# Patient Record
Sex: Female | Born: 1965
Health system: Southern US, Community
[De-identification: ages and names within clinical notes are randomized; demographics above are authoritative.]

## PROBLEM LIST (undated history)

## (undated) DIAGNOSIS — D571 Sickle-cell disease without crisis: Secondary | ICD-10-CM

## (undated) DIAGNOSIS — C801 Malignant (primary) neoplasm, unspecified: Secondary | ICD-10-CM

## (undated) HISTORY — DX: Sickle-cell disease without crisis: D57.1

## (undated) HISTORY — PX: BREAST SURGERY: SHX581

## (undated) HISTORY — PX: ABDOMINAL HYSTERECTOMY: SHX81

## (undated) HISTORY — DX: Malignant (primary) neoplasm, unspecified: C80.1

---

## 1997-07-01 ENCOUNTER — Emergency Department (HOSPITAL_COMMUNITY): Admission: EM | Admit: 1997-07-01 | Discharge: 1997-07-01 | Payer: Self-pay | Admitting: Emergency Medicine

## 1998-06-10 ENCOUNTER — Emergency Department (HOSPITAL_COMMUNITY): Admission: EM | Admit: 1998-06-10 | Discharge: 1998-06-10 | Payer: Self-pay | Admitting: Emergency Medicine

## 1999-04-04 ENCOUNTER — Encounter: Payer: Self-pay | Admitting: Emergency Medicine

## 1999-04-04 ENCOUNTER — Emergency Department (HOSPITAL_COMMUNITY): Admission: EM | Admit: 1999-04-04 | Discharge: 1999-04-04 | Payer: Self-pay | Admitting: Emergency Medicine

## 1999-09-28 ENCOUNTER — Emergency Department (HOSPITAL_COMMUNITY): Admission: EM | Admit: 1999-09-28 | Discharge: 1999-09-28 | Payer: Self-pay

## 1999-12-22 ENCOUNTER — Encounter: Payer: Self-pay | Admitting: *Deleted

## 1999-12-22 ENCOUNTER — Encounter: Admission: RE | Admit: 1999-12-22 | Discharge: 1999-12-22 | Payer: Self-pay | Admitting: *Deleted

## 2003-01-29 ENCOUNTER — Other Ambulatory Visit: Admission: RE | Admit: 2003-01-29 | Discharge: 2003-02-12 | Payer: Self-pay | Admitting: Internal Medicine

## 2003-01-29 ENCOUNTER — Other Ambulatory Visit: Admission: RE | Admit: 2003-01-29 | Discharge: 2003-01-29 | Payer: Self-pay | Admitting: Family Medicine

## 2004-06-23 ENCOUNTER — Encounter: Admission: RE | Admit: 2004-06-23 | Discharge: 2004-06-23 | Payer: Self-pay | Admitting: Internal Medicine

## 2004-07-14 ENCOUNTER — Encounter: Admission: RE | Admit: 2004-07-14 | Discharge: 2004-07-14 | Payer: Self-pay | Admitting: Internal Medicine

## 2004-11-23 ENCOUNTER — Emergency Department (HOSPITAL_COMMUNITY): Admission: EM | Admit: 2004-11-23 | Discharge: 2004-11-23 | Payer: Self-pay | Admitting: Family Medicine

## 2007-03-06 ENCOUNTER — Emergency Department (HOSPITAL_COMMUNITY): Admission: EM | Admit: 2007-03-06 | Discharge: 2007-03-06 | Payer: Self-pay | Admitting: Emergency Medicine

## 2007-04-25 ENCOUNTER — Emergency Department (HOSPITAL_COMMUNITY): Admission: EM | Admit: 2007-04-25 | Discharge: 2007-04-25 | Payer: Self-pay | Admitting: Family Medicine

## 2009-03-15 ENCOUNTER — Inpatient Hospital Stay (HOSPITAL_COMMUNITY): Admission: EM | Admit: 2009-03-15 | Discharge: 2009-03-16 | Payer: Self-pay | Admitting: Emergency Medicine

## 2009-04-10 ENCOUNTER — Inpatient Hospital Stay (HOSPITAL_COMMUNITY): Admission: AD | Admit: 2009-04-10 | Discharge: 2009-04-11 | Payer: Self-pay | Admitting: Obstetrics and Gynecology

## 2009-06-13 ENCOUNTER — Inpatient Hospital Stay (HOSPITAL_COMMUNITY): Admission: RE | Admit: 2009-06-13 | Discharge: 2009-06-15 | Payer: Self-pay | Admitting: Obstetrics and Gynecology

## 2009-06-13 ENCOUNTER — Encounter (INDEPENDENT_AMBULATORY_CARE_PROVIDER_SITE_OTHER): Payer: Self-pay | Admitting: Obstetrics and Gynecology

## 2010-05-28 ENCOUNTER — Emergency Department (HOSPITAL_COMMUNITY)
Admission: EM | Admit: 2010-05-28 | Discharge: 2010-05-28 | Discharge status: Against Medical Advice | Payer: Self-pay | Attending: Emergency Medicine | Admitting: Emergency Medicine

## 2010-05-28 DIAGNOSIS — W298XXA Contact with other powered powered hand tools and household machinery, initial encounter: Secondary | ICD-10-CM | POA: Insufficient documentation

## 2010-05-28 DIAGNOSIS — S61209A Unspecified open wound of unspecified finger without damage to nail, initial encounter: Secondary | ICD-10-CM | POA: Insufficient documentation

## 2010-06-09 LAB — CBC
HCT: 25 % — ABNORMAL LOW (ref 36.0–46.0)
Hemoglobin: 7.9 g/dL — ABNORMAL LOW (ref 12.0–15.0)
MCHC: 31.6 g/dL (ref 30.0–36.0)
MCV: 68 fL — ABNORMAL LOW (ref 78.0–100.0)
Platelets: 252 K/uL (ref 150–400)
RBC: 3.67 MIL/uL — ABNORMAL LOW (ref 3.87–5.11)
RDW: 35.1 % — ABNORMAL HIGH (ref 11.5–15.5)
WBC: 11.6 K/uL — ABNORMAL HIGH (ref 4.0–10.5)

## 2010-06-16 LAB — PREGNANCY, URINE: Preg Test, Ur: NEGATIVE

## 2010-06-16 LAB — CBC
Hemoglobin: 10.6 g/dL — ABNORMAL LOW (ref 12.0–15.0)
Hemoglobin: 9.5 g/dL — ABNORMAL LOW (ref 12.0–15.0)
MCHC: 30.8 g/dL (ref 30.0–36.0)
MCV: 63 fL — ABNORMAL LOW (ref 78.0–100.0)
RBC: 4.69 MIL/uL (ref 3.87–5.11)
RBC: 5.37 MIL/uL — ABNORMAL HIGH (ref 3.87–5.11)
WBC: 13.6 10*3/uL — ABNORMAL HIGH (ref 4.0–10.5)

## 2010-06-16 LAB — BASIC METABOLIC PANEL
CO2: 26 mEq/L (ref 19–32)
Calcium: 8.3 mg/dL — ABNORMAL LOW (ref 8.4–10.5)
Calcium: 9.4 mg/dL (ref 8.4–10.5)
Chloride: 102 mEq/L (ref 96–112)
Creatinine, Ser: 0.59 mg/dL (ref 0.4–1.2)
Creatinine, Ser: 0.6 mg/dL (ref 0.4–1.2)
GFR calc Af Amer: >60 mL/min (ref >60)
GFR calc Af Amer: >60 mL/min (ref >60)
GFR calc non Af Amer: >60 mL/min (ref >60)
Sodium: 134 mEq/L — ABNORMAL LOW (ref 135–145)
Sodium: 135 mEq/L (ref 135–145)

## 2010-06-23 LAB — CROSSMATCH
ABO/RH(D): O POS
Antibody Screen: NEGATIVE

## 2010-06-23 LAB — CBC
HCT: 14.1 % — ABNORMAL LOW (ref 36.0–46.0)
HCT: 15.7 % — ABNORMAL LOW (ref 36.0–46.0)
Hemoglobin: 3.9 g/dL — CL (ref 12.0–15.0)
Hemoglobin: 4.5 g/dL — CL (ref 12.0–15.0)
Hemoglobin: 9.1 g/dL — ABNORMAL LOW (ref 12.0–15.0)
MCHC: 27.9 g/dL — ABNORMAL LOW (ref 30.0–36.0)
MCHC: 28.6 g/dL — ABNORMAL LOW (ref 30.0–36.0)
MCHC: 28.6 g/dL — ABNORMAL LOW (ref 30.0–36.0)
MCHC: 31.5 g/dL (ref 30.0–36.0)
MCV: 48.4 fL — ABNORMAL LOW (ref 78.0–100.0)
MCV: 48.5 fL — ABNORMAL LOW (ref 78.0–100.0)
Platelets: 296 10*3/uL (ref 150–400)
Platelets: 337 K/uL (ref 150–400)
RBC: 2.9 MIL/uL — ABNORMAL LOW (ref 3.87–5.11)
RBC: 3.21 MIL/uL — ABNORMAL LOW (ref 3.87–5.11)
RBC: 3.25 MIL/uL — ABNORMAL LOW (ref 3.87–5.11)
RBC: 4.49 MIL/uL (ref 3.87–5.11)
RDW: 32.9 % — ABNORMAL HIGH (ref 11.5–15.5)
RDW: 33.8 % — ABNORMAL HIGH (ref 11.5–15.5)
RDW: 40.2 % — ABNORMAL HIGH (ref 11.5–15.5)
WBC: 10.2 10*3/uL (ref 4.0–10.5)
WBC: 7.7 10*3/uL (ref 4.0–10.5)
WBC: 8.6 K/uL (ref 4.0–10.5)

## 2010-06-23 LAB — DIFFERENTIAL
Basophils Absolute: 0 K/uL (ref 0.0–0.1)
Basophils Relative: 0 % (ref 0–1)
Blasts: 0 %
Eosinophils Absolute: 0.1 K/uL (ref 0.0–0.7)
Eosinophils Relative: 1 % (ref 0–5)
Lymphocytes Relative: 33 % (ref 12–46)
Lymphs Abs: 3.4 10*3/uL (ref 0.7–4.0)
Monocytes Absolute: 0.5 K/uL (ref 0.1–1.0)
Monocytes Relative: 5 % (ref 3–12)
Myelocytes: 0 %
Neutro Abs: 5.5 10*3/uL (ref 1.7–7.7)
Neutro Abs: 6.2 10*3/uL (ref 1.7–7.7)
Neutrophils Relative %: 61 % (ref 43–77)
Neutrophils Relative %: 65 % (ref 43–77)
Promyelocytes Absolute: 0 %
nRBC: 0 /100 WBC

## 2010-06-23 LAB — VITAMIN B12: Vitamin B-12: 438 pg/mL (ref 211–911)

## 2010-06-23 LAB — POCT I-STAT, CHEM 8
BUN: 5 mg/dL — ABNORMAL LOW (ref 6–23)
Calcium, Ion: 1.26 mmol/L (ref 1.12–1.32)
Chloride: 104 mEq/L (ref 96–112)
Creatinine, Ser: 0.7 mg/dL (ref 0.4–1.2)
Glucose, Bld: 106 mg/dL — ABNORMAL HIGH (ref 70–99)
HCT: 19 % — ABNORMAL LOW (ref 36.0–46.0)
Hemoglobin: 6.5 g/dL — CL (ref 12.0–15.0)
Potassium: 3.6 meq/L (ref 3.5–5.1)
Sodium: 139 meq/L (ref 135–145)
TCO2: 27 mmol/L (ref 0–100)

## 2010-06-23 LAB — PROTEIN ELECTROPHORESIS, SERUM
Albumin ELP: 54 % — ABNORMAL LOW (ref 55.8–66.1)
Alpha-1-Globulin: 5.4 % — ABNORMAL HIGH (ref 2.9–4.9)
Beta 2: 4.4 % (ref 3.2–6.5)
Beta Globulin: 8.1 % — ABNORMAL HIGH (ref 4.7–7.2)

## 2010-06-23 LAB — UIFE/LIGHT CHAINS/TP QN, 24-HR UR
Alpha 1, Urine: DETECTED — AB
Alpha 2, Urine: DETECTED — AB
Beta, Urine: DETECTED — AB
Total Protein, Urine: 0.9 mg/dL

## 2010-06-23 LAB — FOLATE RBC: RBC Folate: 666 ng/mL — ABNORMAL HIGH (ref 180–600)

## 2010-06-23 LAB — BRAIN NATRIURETIC PEPTIDE: Pro B Natriuretic peptide (BNP): 66 pg/mL (ref 0.0–100.0)

## 2010-06-23 LAB — HEMOCCULT GUIAC POC 1CARD (OFFICE): Fecal Occult Bld: NEGATIVE

## 2010-06-23 LAB — IRON AND TIBC
Saturation Ratios: 6 % — ABNORMAL LOW (ref 20–55)
UIBC: 393 ug/dL

## 2010-06-23 LAB — ABO/RH: ABO/RH(D): O POS

## 2010-06-23 LAB — TSH: TSH: 0.991 u[IU]/mL (ref 0.350–4.500)

## 2011-04-08 ENCOUNTER — Emergency Department (HOSPITAL_COMMUNITY)
Admission: EM | Admit: 2011-04-08 | Discharge: 2011-04-08 | Disposition: A | Discharge status: Home / Self Care | Payer: Self-pay | Attending: Emergency Medicine | Admitting: Emergency Medicine

## 2011-04-08 ENCOUNTER — Encounter (HOSPITAL_COMMUNITY): Payer: Self-pay

## 2011-04-08 DIAGNOSIS — R000 Tachycardia, unspecified: Secondary | ICD-10-CM | POA: Insufficient documentation

## 2011-04-08 DIAGNOSIS — R070 Pain in throat: Secondary | ICD-10-CM | POA: Insufficient documentation

## 2011-04-08 DIAGNOSIS — R509 Fever, unspecified: Secondary | ICD-10-CM | POA: Insufficient documentation

## 2011-04-08 DIAGNOSIS — J020 Streptococcal pharyngitis: Secondary | ICD-10-CM | POA: Insufficient documentation

## 2011-04-08 DIAGNOSIS — IMO0001 Reserved for inherently not codable concepts without codable children: Secondary | ICD-10-CM | POA: Insufficient documentation

## 2011-04-08 DIAGNOSIS — R599 Enlarged lymph nodes, unspecified: Secondary | ICD-10-CM | POA: Insufficient documentation

## 2011-04-08 LAB — URINALYSIS, ROUTINE W REFLEX MICROSCOPIC
Bilirubin Urine: NEGATIVE
Glucose, UA: NEGATIVE mg/dL
Ketones, ur: NEGATIVE mg/dL
pH: 5.5 (ref 5.0–8.0)

## 2011-04-08 LAB — URINE MICROSCOPIC-ADD ON

## 2011-04-08 MED ORDER — OXYCODONE-ACETAMINOPHEN 5-325 MG PO TABS: 2.0000 | ORAL_TABLET | ORAL | Status: AC | PRN

## 2011-04-08 MED ORDER — PENICILLIN G BENZATHINE 1200000 UNIT/2ML IM SUSP
1.2000 10*6.[IU] | Freq: Once | INTRAMUSCULAR | Status: AC
  Administered 2011-04-08: 1.2 10*6.[IU] via INTRAMUSCULAR
  Filled 2011-04-08: qty 2

## 2011-04-08 MED ORDER — ACETAMINOPHEN 325 MG PO TABS
650.0000 mg | ORAL_TABLET | Freq: Once | ORAL | Status: AC
  Administered 2011-04-08: 650 mg via ORAL
  Filled 2011-04-08: qty 2

## 2011-04-08 NOTE — ED Notes (Signed)
Pt. Having fever,  Urine smells strong and generalized body aches

## 2011-04-08 NOTE — ED Notes (Signed)
No reaction from PCN injection, pt discharged home

## 2011-04-08 NOTE — ED Provider Notes (Addendum)
History     CSN: [id number]  Arrival date & time 04/08/11  1450   None     Chief Complaint  Patient presents with  . Fever    (Consider location/radiation/quality/duration/timing/severity/associated sxs/prior treatment) Patient is a 46 y.o. female presenting with fever. The history is provided by the patient.  Fever Primary symptoms of the febrile illness include fever and myalgias. Primary symptoms do not include headaches, cough, dysuria or rash.   the patient is a 46 year old, female, with no significant past medical history.  She complains of a fever, and sore throat for 3 days.  She denies nausea, vomiting, cough, diarrhea, rash, or urinary tract symptoms.  She has not been exposed to anybody with similar symptoms.  She has not gotten a flu vaccine this year.  History reviewed. No pertinent past medical history.  Past Surgical History  Procedure Date  . Abdominal hysterectomy     No family history on file.  History  Substance Use Topics  . Smoking status: Current Everyday Smoker  . Smokeless tobacco: Not on file  . Alcohol Use: Yes    OB History    Grav Para Term Preterm Abortions TAB SAB Ect Mult Living                  Review of Systems  Constitutional: Positive for fever and chills.  HENT: Positive for sore throat.   Respiratory: Negative for cough.   Cardiovascular: Negative for chest pain.  Genitourinary: Negative for dysuria.  Musculoskeletal: Positive for myalgias.  Skin: Negative for rash.  Neurological: Negative for headaches.  All other systems reviewed and are negative.    Allergies  Review of patient's allergies indicates no known allergies.  Home Medications   Current Outpatient Rx  Name Route Sig Dispense Refill  . NAPROXEN SODIUM 220 MG PO TABS Oral Take 220 mg by mouth 2 (two) times daily as needed. For pain      BP 135/79  Pulse 106  Temp 102.1 F (38.9 C)  Resp 18  Ht 5\' 4"  (1.626 m)  Wt 185 lb (83.915 kg)  BMI 31.76 kg/m2   SpO2 100%  Physical Exam  Vitals reviewed. Constitutional: She is oriented to person, place, and time. She appears well-developed and well-nourished.  HENT:  Head: Normocephalic and atraumatic.  Mouth/Throat: Oropharyngeal exudate present.  Eyes: Pupils are equal, round, and reactive to light.  Neck: Normal range of motion.  Cardiovascular: Regular rhythm and normal heart sounds.   No murmur heard.      Tachycardia  Pulmonary/Chest: Effort normal and breath sounds normal. No respiratory distress. She has no wheezes. She has no rales.  Abdominal: She exhibits no distension.  Genitourinary:       No CVA tenderness  Musculoskeletal: Normal range of motion. She exhibits no edema and no tenderness.  Lymphadenopathy:    She has cervical adenopathy.  Neurological: She is alert and oriented to person, place, and time. No cranial nerve deficit.  Skin: Skin is warm and dry. No rash noted. No erythema.  Psychiatric: She has a normal mood and affect. Her behavior is normal.    ED Course  Procedures (including critical care time) Exudative pharyngitis with lymphadenopathy and fever.  Symptoms consistent with strep throat.  We will treat her with Bicillin L-A, and Tylenol in emergency department   Labs Reviewed  URINALYSIS, ROUTINE W REFLEX MICROSCOPIC   No results found.   No diagnosis found.    MDM  Exudative pharyngitis.   Nontoxic.  No respiratory distress.  No airway compromise.  She is a normal host.          Nicholes Stairs, MD 04/08/11 1534  Nicholes Stairs, MD 04/08/11 1610  Nicholes Stairs, MD 04/08/11 [phone]

## 2011-09-10 ENCOUNTER — Encounter (HOSPITAL_COMMUNITY): Payer: Self-pay | Admitting: *Deleted

## 2011-09-10 ENCOUNTER — Emergency Department (HOSPITAL_COMMUNITY)
Admission: EM | Admit: 2011-09-10 | Discharge: 2011-09-10 | Disposition: A | Discharge status: Home / Self Care | Payer: Self-pay | Attending: Emergency Medicine | Admitting: Emergency Medicine

## 2011-09-10 DIAGNOSIS — F17200 Nicotine dependence, unspecified, uncomplicated: Secondary | ICD-10-CM | POA: Insufficient documentation

## 2011-09-10 DIAGNOSIS — L255 Unspecified contact dermatitis due to plants, except food: Secondary | ICD-10-CM | POA: Insufficient documentation

## 2011-09-10 DIAGNOSIS — IMO0002 Reserved for concepts with insufficient information to code with codable children: Secondary | ICD-10-CM | POA: Insufficient documentation

## 2011-09-10 DIAGNOSIS — L237 Allergic contact dermatitis due to plants, except food: Secondary | ICD-10-CM

## 2011-09-10 MED ORDER — HYDROXYZINE HCL 25 MG PO TABS: 25.0000 mg | ORAL_TABLET | Freq: Four times a day (QID) | ORAL | Status: AC | PRN

## 2011-09-10 MED ORDER — PREDNISONE 20 MG PO TABS
60.0000 mg | ORAL_TABLET | ORAL | Status: AC
  Administered 2011-09-10: 60 mg via ORAL
  Filled 2011-09-10: qty 3

## 2011-09-10 MED ORDER — PREDNISONE 10 MG PO TABS: 60.0000 mg | ORAL_TABLET | Freq: Every day | ORAL | Status: AC

## 2011-09-10 NOTE — ED Provider Notes (Addendum)
History  This chart was scribed for Gerhard Munch, MD by Bennett Scrape. This patient was seen in room Room/bed info not found and the patient's care was started at 5:37PM.  CSN: [id number]  Arrival date & time 09/10/11  1633   None     Chief Complaint  Patient presents with  . Rash    HPI  Laurie French is a 46 y.o. female who presents to the Emergency Department complaining of 4 days of rash that is itchy and painful on arms, neck, torso, chest and face after she was cutting grass with a weedeater. She denies using pesticide or herbicide. No mouth or genital lesions. No difficult swallowing or breathing. No fevers, emesis and diarrhea. She reports using topical creams without improvement.    History reviewed. No pertinent past medical history.  Past Surgical History  Procedure Date  . Abdominal hysterectomy     No family history on file.  History  Substance Use Topics  . Smoking status: Current Everyday Smoker  . Smokeless tobacco: Not on file  . Alcohol Use: Yes    OB History    Grav Para Term Preterm Abortions TAB SAB Ect Mult Living                  Review of Systems  Allergies  Review of patient's allergies indicates no known allergies.  Home Medications   Current Outpatient Rx  Name Route Sig Dispense Refill  . NAPROXEN SODIUM 220 MG PO TABS Oral Take 220 mg by mouth 2 (two) times daily as needed. For pain      Triage Vitals: BP 131/87  Pulse 99  Temp 98.8 F (37.1 C) (Oral)  Resp 18  Ht 5\' 4"  (1.626 m)  Wt 200 lb (90.719 kg)  BMI 34.33 kg/m2  SpO2 100%  Physical Exam  Nursing note and vitals reviewed. Constitutional: She is oriented to person, place, and time. She appears well-developed and well-nourished. No distress.  HENT:  Head: Normocephalic and atraumatic.  Eyes: Conjunctivae and EOM are normal.  Cardiovascular: Normal rate.   Pulmonary/Chest: Effort normal. No stridor. No respiratory distress.  Abdominal: She exhibits no  distension.  Musculoskeletal: She exhibits no edema.  Neurological: She is alert and oriented to person, place, and time. No cranial nerve deficit.  Skin: Skin is warm and dry.       Diffuse vascular rash on arms, chest, neck and face     Psychiatric: She has a normal mood and affect.    ED Course  Procedures (including critical care time)  COORDINATION OF CARE: 5:41PM-Discussed discharge plan with pt and pt agreed to plan.  Labs Reviewed - No data to display No results found.   No diagnosis found.    MDM  I personally performed the services described in this documentation, which was scribed in my presence. The recorded information has been reviewed and considered.  This previously well female presents with new diffuse rash consistent with exposure to poison ivy or a similar erythematous.  The absence of mucosal lesions, distress, is reassuring, but given the patient's diffuse symptoms, including facial lesions, she'll be started on a short course of steroids to go with antihistamines.  She was discharged in stable condition.      Gerhard Munch, MD 09/10/11 1805  Gerhard Munch, MD 09/10/11 [phone]

## 2011-09-10 NOTE — Discharge Instructions (Signed)
Poison Ivy Poison ivy is a inflammation of the skin (contact dermatitis) caused by touching the allergens on the leaves of the ivy plant following previous exposure to the plant. The rash usually appears 48 hours after exposure. The rash is usually bumps (papules) or blisters (vesicles) in a linear pattern. Depending on your own sensitivity, the rash may simply cause redness and itching, or it may also progress to blisters which may break open. These must be well cared for to prevent secondary bacterial (germ) infection, followed by scarring. Keep any open areas dry, clean, dressed, and covered with an antibacterial ointment if needed. The eyes may also get puffy. The puffiness is worst in the morning and gets better as the day progresses. This dermatitis usually heals without scarring, within 2 to 3 weeks without treatment. HOME CARE INSTRUCTIONS  Thoroughly wash with soap and water as soon as you have been exposed to poison ivy. You have about one half hour to remove the plant resin before it will cause the rash. This washing will destroy the oil or antigen on the skin that is causing, or will cause, the rash. Be sure to wash under your fingernails as any plant resin there will continue to spread the rash. Do not rub skin vigorously when washing affected area. Poison ivy cannot spread if no oil from the plant remains on your body. A rash that has progressed to weeping sores will not spread the rash unless you have not washed thoroughly. It is also important to wash any clothes you have been wearing as these may carry active allergens. The rash will return if you wear the unwashed clothing, even several days later. Avoidance of the plant in the future is the best measure. Poison ivy plant can be recognized by the number of leaves. Generally, poison ivy has three leaves with flowering branches on a single stem. Diphenhydramine may be purchased over the counter and used as needed for itching. Do not drive with  this medication if it makes you drowsy.Ask your caregiver about medication for children. SEEK MEDICAL CARE IF:  Open sores develop.   Redness spreads beyond area of rash.   You notice purulent (pus-like) discharge.   You have increased pain.   Other signs of infection develop (such as fever).  Document Released: 03/06/2000 Document Revised: 02/26/2011 Document Reviewed: 01/23/2009 [employer] Patient Information 2012 [employer]. 

## 2011-09-10 NOTE — ED Notes (Signed)
Patient reports she was woking outside on Saturday.  She is sure she was exposed to poison oak or ivy.  She noted onset of rash on Sunday.  She states the rash has continued to spread. Patient complains of itching.  Patient reports no one else has rash

## 2011-09-23 ENCOUNTER — Encounter (HOSPITAL_COMMUNITY): Payer: Self-pay | Admitting: *Deleted

## 2011-09-23 ENCOUNTER — Emergency Department (HOSPITAL_COMMUNITY)
Admission: EM | Admit: 2011-09-23 | Discharge: 2011-09-23 | Disposition: A | Discharge status: Home / Self Care | Payer: Self-pay | Attending: Emergency Medicine | Admitting: Emergency Medicine

## 2011-09-23 DIAGNOSIS — L0100 Impetigo, unspecified: Secondary | ICD-10-CM | POA: Insufficient documentation

## 2011-09-23 DIAGNOSIS — F17200 Nicotine dependence, unspecified, uncomplicated: Secondary | ICD-10-CM | POA: Insufficient documentation

## 2011-09-23 DIAGNOSIS — B009 Herpesviral infection, unspecified: Secondary | ICD-10-CM | POA: Insufficient documentation

## 2011-09-23 MED ORDER — BENADRYL 25 MG PO TABS: 25.0000 mg | ORAL_TABLET | Freq: Four times a day (QID) | ORAL | Status: DC | PRN

## 2011-09-23 MED ORDER — ACYCLOVIR 400 MG PO TABS: 200.0000 mg | ORAL_TABLET | Freq: Every day | ORAL | Status: AC

## 2011-09-23 MED ORDER — CEPHALEXIN 500 MG PO CAPS: 500.0000 mg | ORAL_CAPSULE | Freq: Four times a day (QID) | ORAL | Status: AC

## 2011-09-23 NOTE — ED Provider Notes (Signed)
History     CSN: [id number]  Arrival date & time 09/23/11  1925   First MD Initiated Contact with Patient 09/23/11 2136      Chief Complaint  Patient presents with  . Pruritis  . Rash    (Consider location/radiation/quality/duration/timing/severity/associated sxs/prior treatment) Patient is a 46 y.o. female presenting with rash. The history is provided by the patient. No language interpreter was used.  Rash  This is a recurrent problem. The current episode started more than 1 week ago. The problem has been gradually worsening. There has been no fever. The rash is present on the trunk, face, left arm and right arm. The pain is at a severity of 3/10. The pain is mild. The pain has been constant since onset. Associated symptoms include itching and pain. Pertinent negatives include no weeping. She has tried anti-itch cream, a cold compress and antihistamines for the symptoms. The treatment provided mild relief.  Rash to chest after treamtment 1 month ago for poisoin ivy.  Also herpetic rash to L buttocks.  History reviewed. No pertinent past medical history.  Past Surgical History  Procedure Date  . Abdominal hysterectomy     No family history on file.  History  Substance Use Topics  . Smoking status: Current Everyday Smoker  . Smokeless tobacco: Not on file  . Alcohol Use: Yes    OB History    Grav Para Term Preterm Abortions TAB SAB Ect Mult Living                  Review of Systems  Constitutional: Negative.  Negative for fever.  HENT: Negative.   Eyes: Negative.   Respiratory: Negative.   Cardiovascular: Negative.   Gastrointestinal: Negative.  Negative for nausea and vomiting.  Skin: Positive for itching and rash.  Neurological: Negative.  Negative for dizziness.  Psychiatric/Behavioral: Negative.   All other systems reviewed and are negative.    Allergies  Review of patient's allergies indicates no known allergies.  Home Medications   Current Outpatient  Rx  Name Route Sig Dispense Refill  . NAPROXEN SODIUM 220 MG PO TABS Oral Take 220 mg by mouth 2 (two) times daily as needed. For pain      BP 123/86  Pulse 83  Temp 98.2 F (36.8 C) (Oral)  Resp 16  SpO2 99%  Physical Exam  Nursing note and vitals reviewed. Constitutional: She is oriented to person, place, and time. She appears well-developed and well-nourished.  HENT:  Head: Normocephalic and atraumatic.  Eyes: Conjunctivae and EOM are normal. Pupils are equal, round, and reactive to light.  Neck: Normal range of motion. Neck supple.  Cardiovascular: Normal rate, regular rhythm and normal heart sounds.   Pulmonary/Chest: Effort normal and breath sounds normal.  Abdominal: Soft. Bowel sounds are normal.  Musculoskeletal: Normal range of motion. She exhibits no edema and no tenderness.  Neurological: She is alert and oriented to person, place, and time. She has normal reflexes.  Skin: Skin is warm and dry. Rash noted.       Impetigo to chest and herpetic lesion to L buttock  Psychiatric: She has a normal mood and affect.    ED Course  Procedures (including critical care time)  Labs Reviewed - No data to display No results found.   No diagnosis found.    MDM  Impetigo to chest and herpetic lesion to L buttocks.  Treated with keflex and acyclovir.  Benadryl for itching.  Return if worse.  Dr. Margo Aye referral  for dermatology.          Remi Haggard, NP 09/24/11 [phone]

## 2011-09-23 NOTE — ED Notes (Signed)
Pt was seen 2 weeks ago and told she has poision ivy.  Pt took medication for this without relief and she now has generalized itching rash.  No other person in her household has anything similar

## 2011-09-23 NOTE — ED Notes (Signed)
Pt for discharge.Discharge instruction give.GCS 15.

## 2011-09-28 NOTE — ED Provider Notes (Signed)
Medical screening examination/treatment/procedure(s) were performed by non-physician practitioner and as supervising physician I was immediately available for consultation/collaboration.  Pheng Prokop, MD 09/28/11 0716 

## 2011-11-26 ENCOUNTER — Emergency Department (HOSPITAL_COMMUNITY)
Admission: EM | Admit: 2011-11-26 | Discharge: 2011-11-26 | Disposition: A | Discharge status: Home / Self Care | Payer: Self-pay | Attending: Emergency Medicine | Admitting: Emergency Medicine

## 2011-11-26 ENCOUNTER — Emergency Department (HOSPITAL_COMMUNITY): Payer: Self-pay

## 2011-11-26 ENCOUNTER — Encounter (HOSPITAL_COMMUNITY): Payer: Self-pay | Admitting: Emergency Medicine

## 2011-11-26 DIAGNOSIS — X500XXA Overexertion from strenuous movement or load, initial encounter: Secondary | ICD-10-CM | POA: Insufficient documentation

## 2011-11-26 DIAGNOSIS — S93409A Sprain of unspecified ligament of unspecified ankle, initial encounter: Secondary | ICD-10-CM

## 2011-11-26 DIAGNOSIS — S99929A Unspecified injury of unspecified foot, initial encounter: Secondary | ICD-10-CM | POA: Insufficient documentation

## 2011-11-26 DIAGNOSIS — Y93H9 Activity, other involving exterior property and land maintenance, building and construction: Secondary | ICD-10-CM | POA: Insufficient documentation

## 2011-11-26 DIAGNOSIS — Y998 Other external cause status: Secondary | ICD-10-CM | POA: Insufficient documentation

## 2011-11-26 DIAGNOSIS — F17200 Nicotine dependence, unspecified, uncomplicated: Secondary | ICD-10-CM | POA: Insufficient documentation

## 2011-11-26 DIAGNOSIS — S8990XA Unspecified injury of unspecified lower leg, initial encounter: Secondary | ICD-10-CM | POA: Insufficient documentation

## 2011-11-26 NOTE — ED Notes (Signed)
Pt requested to know how long she would have to wear splint form. This RN asked dr. Freida Busman and reported to pt 7-10 days. Pt threw her d/c instructions down and stated "well I need new paperwork." This RN informed the pt that the paperwork recommended she follow up with an orthopedist and that it is still recommended and that I could write on the paperwork how long it was recommended for pt to wear splint per Dr. Freida Busman. Pt began to yell at this RN stating "I need someone new to help me because you have a type A personality and so do I and I don't like it." This RN obliged and Crecencio Mc, RN went in to d/c pt.

## 2011-11-26 NOTE — ED Notes (Signed)
Pt states that on Friday she was mowing grass and took a bad step. States that she put mud on it and the swelling went down some, but it is still hurting and swollen. Pt able to ambulate. Swelling noted to right ankle.

## 2011-11-26 NOTE — ED Notes (Signed)
MD at bedside. 

## 2011-11-26 NOTE — ED Notes (Signed)
Ortho paged for splint/crutches °

## 2011-11-26 NOTE — ED Notes (Signed)
Pt c/o right ankle pain after stepping wrong on Friday; pt sts still having pain

## 2011-11-26 NOTE — ED Provider Notes (Signed)
History   Scribed for Toy Baker, MD, the patient was seen in room TR11C/TR11C . This chart was scribed by Lewanda Rife.   CSN: [id number]  Arrival date & time 11/26/11  1300   First MD Initiated Contact with Patient 11/26/11 1406      Chief Complaint  Patient presents with  . Ankle Pain    (Consider location/radiation/quality/duration/timing/severity/associated sxs/prior treatment) The history is provided by the patient.   Laurie French is a 46 y.o. female who presents to the Emergency Department complaining of moderate intermittent right ankle pain for the past 7 days after twisting it when cutting the grass. Pt states pain intensity increases with movement and bearing weight. Pt reports she tried using mud with vinegar to decrease swelling and reports some relief with this "home remedy."  History reviewed. No pertinent past medical history.  Past Surgical History  Procedure Date  . Abdominal hysterectomy     History reviewed. No pertinent family history.  History  Substance Use Topics  . Smoking status: Current Everyday Smoker  . Smokeless tobacco: Not on file  . Alcohol Use: Yes    OB History    Grav Para Term Preterm Abortions TAB SAB Ect Mult Living                  Review of Systems  Constitutional: Negative.   HENT: Negative.   Respiratory: Negative.   Cardiovascular: Negative.   Gastrointestinal: Negative.   Musculoskeletal: Negative.        Right ankle pain   Skin: Negative.   Neurological: Negative.   Hematological: Negative.   Psychiatric/Behavioral: Negative.   All other systems reviewed and are negative.    Allergies  Latex  Home Medications   Current Outpatient Rx  Name Route Sig Dispense Refill  . OVER THE COUNTER MEDICATION Topical Apply 1 application topically daily as needed. For irritation   Deep roots healing cream      BP 150/90  Pulse 85  Temp 98.8 F (37.1 C) (Oral)  Resp 18  SpO2 98%  Physical Exam    Nursing note and vitals reviewed. Constitutional: She is oriented to person, place, and time. She appears well-developed and well-nourished.  HENT:  Head: Normocephalic and atraumatic.  Eyes: Conjunctivae are normal. Pupils are equal, round, and reactive to light.  Neck: Neck supple. No tracheal deviation present. No thyromegaly present.  Cardiovascular: Normal rate and regular rhythm.   No murmur heard. Pulmonary/Chest: Effort normal and breath sounds normal.  Abdominal: Soft. Bowel sounds are normal. She exhibits no distension. There is no tenderness.  Musculoskeletal: Normal range of motion. She exhibits no edema and no tenderness.       Right ankle has moderate swelling with no ecchymosis, skin is intact Mild tenderness to palpation on distal right lateral malleolus    Neurological: She is alert and oriented to person, place, and time. Coordination normal.  Skin: Skin is warm and dry. No rash noted.  Psychiatric: She has a normal mood and affect.    ED Course  Procedures (including critical care time)  Labs Reviewed - No data to display Dg Ankle Complete Right  11/26/2011  *RADIOLOGY REPORT*  Clinical Data: Pain post trauma  RIGHT ANKLE - COMPLETE 3+ VIEW  Comparison: None.  Findings: Frontal, oblique, and lateral views were obtained.  There is swelling laterally.  There is no apparent fracture or effusion. The ankle mortise appears intact.  There is mild spurring in the dorsal mid foot region.  IMPRESSION: Soft tissue swelling laterally.  No fracture or effusion.   Original Report Authenticated By: Arvin Collard. WOODRUFF III, M.D.      No diagnosis found.    MDM  Splint and crutches given      I personally performed the services described in this documentation, which was scribed in my presence. The recorded information has been reviewed and considered.    Toy Baker, MD 11/26/11 [phone]

## 2011-11-26 NOTE — Progress Notes (Signed)
Orthopedic Tech Progress Note Patient Details:  Laurie French Aug 16, 1965 [id number]  Ortho Devices Type of Ortho Device: ASO;Crutches Ortho Device/Splint Location: right ankle Ortho Device/Splint Interventions: Application   Peggyann Zwiefelhofer 11/26/2011, 2:54 PM

## 2015-02-01 ENCOUNTER — Other Ambulatory Visit: Payer: Self-pay

## 2015-02-01 DIAGNOSIS — Z1231 Encounter for screening mammogram for malignant neoplasm of breast: Secondary | ICD-10-CM

## 2015-02-27 ENCOUNTER — Ambulatory Visit: Payer: Self-pay

## 2015-03-24 DIAGNOSIS — C801 Malignant (primary) neoplasm, unspecified: Secondary | ICD-10-CM

## 2015-03-24 HISTORY — DX: Malignant (primary) neoplasm, unspecified: C80.1

## 2015-07-19 ENCOUNTER — Ambulatory Visit
Admission: RE | Admit: 2015-07-19 | Discharge: 2015-07-19 | Disposition: A | Discharge status: Home / Self Care | Payer: BLUE CROSS/BLUE SHIELD | Source: Ambulatory Visit

## 2015-07-19 DIAGNOSIS — Z1231 Encounter for screening mammogram for malignant neoplasm of breast: Secondary | ICD-10-CM

## 2015-07-23 ENCOUNTER — Other Ambulatory Visit: Payer: Self-pay | Admitting: Internal Medicine

## 2015-07-23 DIAGNOSIS — R928 Other abnormal and inconclusive findings on diagnostic imaging of breast: Secondary | ICD-10-CM

## 2015-07-26 ENCOUNTER — Ambulatory Visit
Admission: RE | Admit: 2015-07-26 | Discharge: 2015-07-26 | Disposition: A | Discharge status: Home / Self Care | Payer: BLUE CROSS/BLUE SHIELD | Source: Ambulatory Visit | Attending: Internal Medicine | Admitting: Internal Medicine

## 2015-07-26 ENCOUNTER — Other Ambulatory Visit: Payer: Self-pay | Admitting: Internal Medicine

## 2015-07-26 DIAGNOSIS — R928 Other abnormal and inconclusive findings on diagnostic imaging of breast: Secondary | ICD-10-CM

## 2015-07-26 DIAGNOSIS — N6310 Unspecified lump in the right breast, unspecified quadrant: Secondary | ICD-10-CM

## 2015-07-30 ENCOUNTER — Other Ambulatory Visit: Payer: Self-pay | Admitting: Internal Medicine

## 2015-07-30 DIAGNOSIS — N6310 Unspecified lump in the right breast, unspecified quadrant: Secondary | ICD-10-CM

## 2015-07-31 ENCOUNTER — Ambulatory Visit
Admission: RE | Admit: 2015-07-31 | Discharge: 2015-07-31 | Disposition: A | Discharge status: Home / Self Care | Payer: BLUE CROSS/BLUE SHIELD | Source: Ambulatory Visit | Attending: Internal Medicine | Admitting: Internal Medicine

## 2015-07-31 DIAGNOSIS — N6310 Unspecified lump in the right breast, unspecified quadrant: Secondary | ICD-10-CM

## 2015-11-05 ENCOUNTER — Ambulatory Visit (INDEPENDENT_AMBULATORY_CARE_PROVIDER_SITE_OTHER): Payer: BLUE CROSS/BLUE SHIELD | Admitting: Physician Assistant

## 2015-11-05 VITALS — BP 126/80 | HR 114 | Temp 98.4°F | Resp 18 | Ht 64.0 in | Wt 231.0 lb

## 2015-11-05 DIAGNOSIS — C50919 Malignant neoplasm of unspecified site of unspecified female breast: Secondary | ICD-10-CM | POA: Insufficient documentation

## 2015-11-05 DIAGNOSIS — T7840XA Allergy, unspecified, initial encounter: Secondary | ICD-10-CM | POA: Diagnosis not present

## 2015-11-05 MED ORDER — RANITIDINE HCL 150 MG PO TABS: 150.0000 mg | ORAL_TABLET | Freq: Two times a day (BID) | ORAL | 0 refills | Status: DC

## 2015-11-05 MED ORDER — EPINEPHRINE 0.3 MG/0.3ML IJ SOAJ: 0.3000 mg | Freq: Once | INTRAMUSCULAR | 1 refills | Status: AC

## 2015-11-05 NOTE — Progress Notes (Signed)
Laurie French  MRN: [id number] DOB: 06-04-1965  PCP: PROVIDER NOT IN SYSTEM  Subjective:  Pt is a 50 year old female currently on chemotherapy every 3 weeks for breast cancer (dx June 2017), presents to clinic for allergic reaction. Two nights ago she ate [place] and mashed potatoes with gravy and cranberry sauce at [employer]. Less than an hour later she felt itchy all over. That night at home she ate her [employer] leftovers from the same meal and again became itchy all over. She soon noted swelling of her lower lip and tongue and the feeling that her "throat felt funny and was closing up". She took one benadryl and went to sleep. Then next morning her throat and tongue symptoms had cleared, but she still had a swollen lower lip.  Last night, she fixed organic chicken wings from the [employer] and again experienced a funny feeling in her tongue and throat and as if her throat was swelling. She took one benadryl.   She is here today because she has hives and is itching all over her body and she has swelling of her lower lip. Also states her tongue "feels funny".  Denies difficulty breathing today, abdominal pain, nausea, vomiting, wheezing  This is the first episode of an allergic reaction. No known food allergies. Allergic to laytex.   Her chemo treatment does make her itchy on palms and soles only. Last chemo treatment 8 days ago via port in her left breast. Gets chemo every 3 weeks.  Diagnosed with cancer in her right breast June 2017.    Review of Systems  Constitutional: Positive for appetite change (due to current chemo treatment regimen ). Negative for diaphoresis.  HENT: Positive for facial swelling (lower lip and tongue) and trouble swallowing.   Respiratory: Positive for shortness of breath (difficulty breathing ). Negative for apnea, cough, choking and chest tightness.   Cardiovascular: Negative.   Gastrointestinal: Negative.   Skin: Positive for rash (wheals all over body).    Allergic/Immunologic: Positive for food allergies (unknown food allergy. Episode occured after meal) and immunocompromised state (breast cancer with current chemo treatment ). Negative for environmental allergies.  Neurological: Negative.   Psychiatric/Behavioral: Negative for decreased concentration and sleep disturbance. The patient is not nervous/anxious.     There are no active problems to display for this patient.   Current Outpatient Prescriptions on File Prior to Visit  Medication Sig Dispense Refill  . OVER THE COUNTER MEDICATION Apply 1 application topically daily as needed. For irritation   Deep roots healing cream     No current facility-administered medications on file prior to visit.     Allergies  Allergen Reactions  . Latex Itching and Rash    Objective:  BP 126/80 (BP Location: Right Arm, Patient Position: Sitting, Cuff Size: Large)   Pulse (!) 114   Temp 98.4 F (36.9 C) (Oral)   Resp 18   Ht 5\' 4"  (1.626 m)   Wt 231 lb (104.8 kg)   SpO2 98%   BMI 39.65 kg/m   Physical Exam  Constitutional: She is well-developed, well-nourished, and in no distress. No distress.  HENT:  Angioedema of lower lip. No swelling of tongue or oropharynx noted.   Cardiovascular: Regular rhythm, S1 normal, S2 normal and normal pulses.  Tachycardia present.   Skin: Skin is warm and dry. Rash noted. Rash is urticarial. She is not diaphoretic.  Urticarial plaques on face, b/l arms, legs, back and abdomen. The patient scratches them multiple times  throughout the exam.   Vitals reviewed.   Assessment and Plan :  1. Allergic reaction, initial encounter - EPINEPHrine 0.3 mg/0.3 mL IJ SOAJ injection; Inject 0.3 mLs (0.3 mg total) into the muscle once.  Dispense: 2 Device; Refill: 1 - ranitidine (ZANTAC) 150 MG tablet; Take 1 tablet (150 mg total) by mouth 2 (two) times daily.  Dispense: 60 tablet; Refill: 0 - Ambulatory referral to Allergy  - Patient declines steroid medications. Says  they make her "puff up". Discussed the benefit of steroids for her condition, she understands and refuses.  - Patient instructed to follow a two week regimen of Zantac, Zyrtec and Benadryl to reduce her current symptoms and to prevent her from rebounding.  She understands. Patient understands to use her Epi pen and to go immediately to the ED should she experience episodes of problems with her airway.   Mercer Pod, PA-C  [facility] 11/05/2015 12:34 PM

## 2015-11-05 NOTE — Patient Instructions (Addendum)
Please go get Zyrtec from your pharmacy. Take Zantac, Zyrtec and Benadryl as directed for the next TWO WEEKS.  This will help you from having a rebound reaction. Go to the emergency department if you feel your tongue/throat swelling or are having difficulty breathing.      Anaphylactic Reaction An anaphylactic reaction is a sudden, severe allergic reaction that involves the whole body. It can be life threatening. A hospital stay is often required. People with asthma, eczema, or hay fever are slightly more likely to have an anaphylactic reaction. CAUSES  An anaphylactic reaction may be caused by anything to which you are allergic. After being exposed to the allergic substance, your immune system becomes sensitized to it. When you are exposed to that allergic substance again, an allergic reaction can occur. Common causes of an anaphylactic reaction include:  Medicines.  Foods, especially peanuts, wheat, shellfish, milk, and eggs.  Insect bites or stings.  Blood products.  Chemicals, such as dyes, latex, and contrast material used for imaging tests. SYMPTOMS  When an allergic reaction occurs, the body releases histamine and other substances. These substances cause symptoms such as tightening of the airway. Symptoms often develop within seconds or minutes of exposure. Symptoms may include:  Skin rash or hives.  Itching.  Chest tightness.  Swelling of the eyes, tongue, or lips.  Trouble breathing or swallowing.  Lightheadedness or fainting.  Anxiety or confusion.  Stomach pains, vomiting, or diarrhea.  Nasal congestion.  A fast or irregular heartbeat (palpitations). DIAGNOSIS  Diagnosis is based on your history of recent exposure to allergic substances, your symptoms, and a physical exam. Your caregiver may also perform blood or urine tests to confirm the diagnosis. TREATMENT  Epinephrine medicine is the main treatment for an anaphylactic reaction. Other medicines that may be  used for treatment include antihistamines, steroids, and albuterol. In severe cases, fluids and medicine to support blood pressure may be given through an intravenous line (IV). Even if you improve after treatment, you need to be observed to make sure your condition does not get worse. This may require a stay in the hospital. [place] a medical alert bracelet or necklace stating your allergy.  You and your family must learn how to use an anaphylaxis kit or give an epinephrine injection to temporarily treat an emergency allergic reaction. Always carry your epinephrine injection or anaphylaxis kit with you. This can be lifesaving if you have a severe reaction.  Do not drive or perform tasks after treatment until the medicines used to treat your reaction have worn off, or until your caregiver says it is okay.  If you have hives or a rash:  Take medicines as directed by your caregiver.  You may use an over-the-counter antihistamine (diphenhydramine) as needed.  Apply cold compresses to the skin or take baths in cool water. Avoid hot baths or showers. SEEK MEDICAL CARE IF:   You develop symptoms of an allergic reaction to a new substance. Symptoms may start right away or minutes later.  You develop a rash, hives, or itching.  You develop new symptoms. SEEK IMMEDIATE MEDICAL CARE IF:   You have swelling of the mouth, difficulty breathing, or wheezing.  You have a tight feeling in your chest or throat.  You develop hives, swelling, or itching all over your body.  You develop severe vomiting or diarrhea.  You feel faint or pass out. This is an emergency. Use your epinephrine injection or anaphylaxis kit as you have  been instructed. Call your local emergency services (911 in [place] Even if you improve after the injection, you need to be examined at a hospital emergency department. MAKE SURE YOU:   Understand these instructions.  Will watch your condition.  Will  get help right away if you are not doing well or get worse.   This information is not intended to replace advice given to you by your health care provider. Make sure you discuss any questions you have with your health care provider.   Document Released: 03/09/2005 Document Revised: 03/14/2013 Document Reviewed: 09/19/2014 Elsevier Interactive Patient Education 2016 [employer].     IF you received an x-ray today, you will receive an invoice from [facility]. Please contact [facility] at [phone] with questions or concerns regarding your invoice.   IF you received labwork today, you will receive an invoice from [employer]. Please contact [facility] at [phone] with questions or concerns regarding your invoice.   Our billing staff will not be able to assist you with questions regarding bills from these companies.  You will be contacted with the lab results as soon as they are available. The fastest way to get your results is to activate your My Chart account. Instructions are located on the last page of this paperwork. If you have not heard from [place] regarding the results in 2 weeks, please contact this office.

## 2016-04-01 DIAGNOSIS — Z170 Estrogen receptor positive status [ER+]: Secondary | ICD-10-CM | POA: Diagnosis not present

## 2016-04-01 DIAGNOSIS — Z5112 Encounter for antineoplastic immunotherapy: Secondary | ICD-10-CM | POA: Diagnosis not present

## 2016-04-01 DIAGNOSIS — Z90710 Acquired absence of both cervix and uterus: Secondary | ICD-10-CM | POA: Diagnosis not present

## 2016-04-01 DIAGNOSIS — Z91040 Latex allergy status: Secondary | ICD-10-CM | POA: Diagnosis not present

## 2016-04-01 DIAGNOSIS — Z9889 Other specified postprocedural states: Secondary | ICD-10-CM | POA: Diagnosis not present

## 2016-04-01 DIAGNOSIS — N6489 Other specified disorders of breast: Secondary | ICD-10-CM | POA: Diagnosis not present

## 2016-04-01 DIAGNOSIS — C50911 Malignant neoplasm of unspecified site of right female breast: Secondary | ICD-10-CM | POA: Diagnosis not present

## 2016-04-22 DIAGNOSIS — Z6839 Body mass index (BMI) 39.0-39.9, adult: Secondary | ICD-10-CM | POA: Diagnosis not present

## 2016-04-22 DIAGNOSIS — Z95828 Presence of other vascular implants and grafts: Secondary | ICD-10-CM | POA: Diagnosis not present

## 2016-04-22 DIAGNOSIS — Z08 Encounter for follow-up examination after completed treatment for malignant neoplasm: Secondary | ICD-10-CM | POA: Diagnosis not present

## 2016-04-22 DIAGNOSIS — Z91040 Latex allergy status: Secondary | ICD-10-CM | POA: Diagnosis not present

## 2016-04-22 DIAGNOSIS — Z170 Estrogen receptor positive status [ER+]: Secondary | ICD-10-CM | POA: Diagnosis not present

## 2016-04-22 DIAGNOSIS — Z801 Family history of malignant neoplasm of trachea, bronchus and lung: Secondary | ICD-10-CM | POA: Diagnosis not present

## 2016-04-22 DIAGNOSIS — Z9221 Personal history of antineoplastic chemotherapy: Secondary | ICD-10-CM | POA: Diagnosis not present

## 2016-04-22 DIAGNOSIS — R200 Anesthesia of skin: Secondary | ICD-10-CM | POA: Diagnosis not present

## 2016-04-22 DIAGNOSIS — N951 Menopausal and female climacteric states: Secondary | ICD-10-CM | POA: Diagnosis not present

## 2016-04-22 DIAGNOSIS — R202 Paresthesia of skin: Secondary | ICD-10-CM | POA: Diagnosis not present

## 2016-04-22 DIAGNOSIS — Z9011 Acquired absence of right breast and nipple: Secondary | ICD-10-CM | POA: Diagnosis not present

## 2016-04-22 DIAGNOSIS — L819 Disorder of pigmentation, unspecified: Secondary | ICD-10-CM | POA: Diagnosis not present

## 2016-04-22 DIAGNOSIS — Z87891 Personal history of nicotine dependence: Secondary | ICD-10-CM | POA: Diagnosis not present

## 2016-04-22 DIAGNOSIS — Z90710 Acquired absence of both cervix and uterus: Secondary | ICD-10-CM | POA: Diagnosis not present

## 2016-04-22 DIAGNOSIS — C50811 Malignant neoplasm of overlapping sites of right female breast: Secondary | ICD-10-CM | POA: Diagnosis not present

## 2016-04-22 DIAGNOSIS — L659 Nonscarring hair loss, unspecified: Secondary | ICD-10-CM | POA: Diagnosis not present

## 2016-04-22 DIAGNOSIS — Z853 Personal history of malignant neoplasm of breast: Secondary | ICD-10-CM | POA: Diagnosis not present

## 2016-04-22 DIAGNOSIS — R05 Cough: Secondary | ICD-10-CM | POA: Diagnosis not present

## 2016-04-23 DIAGNOSIS — C50911 Malignant neoplasm of unspecified site of right female breast: Secondary | ICD-10-CM | POA: Diagnosis not present

## 2016-04-23 DIAGNOSIS — Z5112 Encounter for antineoplastic immunotherapy: Secondary | ICD-10-CM | POA: Diagnosis not present

## 2016-05-11 DIAGNOSIS — Z5112 Encounter for antineoplastic immunotherapy: Secondary | ICD-10-CM | POA: Diagnosis not present

## 2016-05-11 DIAGNOSIS — C50911 Malignant neoplasm of unspecified site of right female breast: Secondary | ICD-10-CM | POA: Diagnosis not present

## 2016-06-01 DIAGNOSIS — Z90710 Acquired absence of both cervix and uterus: Secondary | ICD-10-CM | POA: Diagnosis not present

## 2016-06-01 DIAGNOSIS — C50911 Malignant neoplasm of unspecified site of right female breast: Secondary | ICD-10-CM | POA: Diagnosis not present

## 2016-06-01 DIAGNOSIS — Z91040 Latex allergy status: Secondary | ICD-10-CM | POA: Diagnosis not present

## 2016-06-01 DIAGNOSIS — Z6838 Body mass index (BMI) 38.0-38.9, adult: Secondary | ICD-10-CM | POA: Diagnosis not present

## 2016-06-01 DIAGNOSIS — N951 Menopausal and female climacteric states: Secondary | ICD-10-CM | POA: Diagnosis not present

## 2016-06-01 DIAGNOSIS — Z780 Asymptomatic menopausal state: Secondary | ICD-10-CM | POA: Diagnosis not present

## 2016-06-01 DIAGNOSIS — Z170 Estrogen receptor positive status [ER+]: Secondary | ICD-10-CM | POA: Diagnosis not present

## 2016-06-01 DIAGNOSIS — Z9221 Personal history of antineoplastic chemotherapy: Secondary | ICD-10-CM | POA: Diagnosis not present

## 2016-06-01 DIAGNOSIS — I10 Essential (primary) hypertension: Secondary | ICD-10-CM | POA: Diagnosis not present

## 2016-06-01 DIAGNOSIS — Z87891 Personal history of nicotine dependence: Secondary | ICD-10-CM | POA: Diagnosis not present

## 2016-06-01 DIAGNOSIS — C50311 Malignant neoplasm of lower-inner quadrant of right female breast: Secondary | ICD-10-CM | POA: Diagnosis not present

## 2016-06-01 DIAGNOSIS — Z801 Family history of malignant neoplasm of trachea, bronchus and lung: Secondary | ICD-10-CM | POA: Diagnosis not present

## 2016-06-02 DIAGNOSIS — C50911 Malignant neoplasm of unspecified site of right female breast: Secondary | ICD-10-CM | POA: Diagnosis not present

## 2016-06-02 DIAGNOSIS — Z5112 Encounter for antineoplastic immunotherapy: Secondary | ICD-10-CM | POA: Diagnosis not present

## 2016-06-02 DIAGNOSIS — Z170 Estrogen receptor positive status [ER+]: Secondary | ICD-10-CM | POA: Diagnosis not present

## 2016-06-05 DIAGNOSIS — C50911 Malignant neoplasm of unspecified site of right female breast: Secondary | ICD-10-CM | POA: Diagnosis not present

## 2016-06-05 DIAGNOSIS — Z170 Estrogen receptor positive status [ER+]: Secondary | ICD-10-CM | POA: Diagnosis not present

## 2016-06-05 DIAGNOSIS — Z01810 Encounter for preprocedural cardiovascular examination: Secondary | ICD-10-CM | POA: Diagnosis not present

## 2016-09-23 ENCOUNTER — Emergency Department (HOSPITAL_COMMUNITY)
Admission: EM | Admit: 2016-09-23 | Discharge: 2016-09-23 | Disposition: A | Discharge status: Home / Self Care | Payer: BLUE CROSS/BLUE SHIELD | Attending: Emergency Medicine | Admitting: Emergency Medicine

## 2016-09-23 ENCOUNTER — Encounter (HOSPITAL_COMMUNITY): Payer: Self-pay | Admitting: Emergency Medicine

## 2016-09-23 DIAGNOSIS — Y92512 Supermarket, store or market as the place of occurrence of the external cause: Secondary | ICD-10-CM | POA: Insufficient documentation

## 2016-09-23 DIAGNOSIS — F17210 Nicotine dependence, cigarettes, uncomplicated: Secondary | ICD-10-CM | POA: Diagnosis not present

## 2016-09-23 DIAGNOSIS — G8911 Acute pain due to trauma: Secondary | ICD-10-CM | POA: Diagnosis not present

## 2016-09-23 DIAGNOSIS — M62838 Other muscle spasm: Secondary | ICD-10-CM | POA: Diagnosis not present

## 2016-09-23 DIAGNOSIS — S79919A Unspecified injury of unspecified hip, initial encounter: Secondary | ICD-10-CM | POA: Diagnosis not present

## 2016-09-23 DIAGNOSIS — S86811A Strain of other muscle(s) and tendon(s) at lower leg level, right leg, initial encounter: Secondary | ICD-10-CM | POA: Diagnosis not present

## 2016-09-23 DIAGNOSIS — Y939 Activity, unspecified: Secondary | ICD-10-CM | POA: Insufficient documentation

## 2016-09-23 DIAGNOSIS — Y999 Unspecified external cause status: Secondary | ICD-10-CM | POA: Insufficient documentation

## 2016-09-23 DIAGNOSIS — Z79899 Other long term (current) drug therapy: Secondary | ICD-10-CM | POA: Diagnosis not present

## 2016-09-23 DIAGNOSIS — S8982XA Other specified injuries of left lower leg, initial encounter: Secondary | ICD-10-CM | POA: Diagnosis not present

## 2016-09-23 DIAGNOSIS — S76312A Strain of muscle, fascia and tendon of the posterior muscle group at thigh level, left thigh, initial encounter: Secondary | ICD-10-CM | POA: Diagnosis not present

## 2016-09-23 DIAGNOSIS — Z853 Personal history of malignant neoplasm of breast: Secondary | ICD-10-CM | POA: Insufficient documentation

## 2016-09-23 DIAGNOSIS — W19XXXA Unspecified fall, initial encounter: Secondary | ICD-10-CM

## 2016-09-23 DIAGNOSIS — S76912A Strain of unspecified muscles, fascia and tendons at thigh level, left thigh, initial encounter: Secondary | ICD-10-CM | POA: Diagnosis not present

## 2016-09-23 DIAGNOSIS — W0110XA Fall on same level from slipping, tripping and stumbling with subsequent striking against unspecified object, initial encounter: Secondary | ICD-10-CM | POA: Diagnosis not present

## 2016-09-23 DIAGNOSIS — S86911A Strain of unspecified muscle(s) and tendon(s) at lower leg level, right leg, initial encounter: Secondary | ICD-10-CM | POA: Diagnosis not present

## 2016-09-23 MED ORDER — NAPROXEN 500 MG PO TABS: 500.0000 mg | ORAL_TABLET | Freq: Two times a day (BID) | ORAL | 0 refills | Status: DC | PRN

## 2016-09-23 MED ORDER — CYCLOBENZAPRINE HCL 10 MG PO TABS: 10.0000 mg | ORAL_TABLET | Freq: Three times a day (TID) | ORAL | 0 refills | Status: DC | PRN

## 2016-09-23 NOTE — ED Triage Notes (Signed)
Patient here via [facility] with complaints of bilateral leg pain increased on left side after fall at [facility] today. Reports that she slipped in puddle of water. Denies hitting head, no LOC.

## 2016-09-23 NOTE — Discharge Instructions (Signed)
Take naprosyn as directed for inflammation and pain (take twice daily with food for the next 2 days, then as needed thereafter) with tylenol for breakthrough pain and flexeril for muscle relaxation. Do not drink, drive, or operate machinery with muscle relaxant use. Ice to areas of soreness for the next 24 hours and then may move to heat, no more than 20 minutes at a time every hour for each. Expect to be sore for the next few days and follow up with primary care physician for recheck of ongoing symptoms in the next 1-2 weeks. Return to ER for emergent changing or worsening of symptoms.

## 2016-09-23 NOTE — ED Notes (Signed)
Bed: WTR8 Expected date:  Expected time:  Means of arrival:  Comments: 51 yo f left leg pain after fall

## 2016-09-23 NOTE — ED Provider Notes (Signed)
[place] DEPT Provider Note   CSN: [id number] Arrival date & time: 09/23/16  1255   By signing my name below, I, Evelene Croon, attest that this documentation has been prepared under the direction and in the presence of [address] Demetr[address] Combes, [employer]. Electronically Signed: Evelene Croon, Scribe. 09/23/2016. 1:28 PM.  History   Chief Complaint Chief Complaint  Patient presents with  . Leg Injury  . Leg Pain    The history is provided by the patient and medical records. No language interpreter was used.  Leg Pain   This is a new problem. The current episode started 1 to 2 hours ago. The problem occurs constantly. The problem has not changed since onset.The pain is present in the left upper leg, right lower leg and left lower leg. Quality: spasming. The pain is at a severity of 7/10. The pain is moderate. Pertinent negatives include no numbness, full range of motion and no tingling. Exacerbated by: nothing. She has tried nothing for the symptoms. The treatment provided no relief. There has been a history of trauma.     HPI Comments: Laurie French is a 51 y.o. female who presents to the Emergency Department via EMS complaining of constant BLE pain s/p fall this afternoon. She slipped in a puddle at [facility] and left leg extended forward and landed in a split position. No head injury or LOC. Pain is greater in the posterior L thigh and right posterior calf. She states she feels like she is having spasms in those areas. She describes her pain as 7/10 constant nonradiating spasmy type pain in the R calf and L posterior leg.  She notes associated mild back pain near her buttocks. No exacerbating or alleviating factors noted; no treatments tried PTA. She denies head inj, LOC, CP, SOB, abdominal pain, N/V, bowel/bladder incontinence, saddle anesthesia or cauda equina symptoms, numbness/tingling, focal weakness, bruising, or any other complaints at this time.    Past Medical History:  Diagnosis Date  .  Cancer ([place])   . Sickle cell anemia [facility])     Patient Active Problem List   Diagnosis Date Noted  . Breast cancer ([place]) 11/05/2015    Past Surgical History:  Procedure Laterality Date  . ABDOMINAL HYSTERECTOMY    . BREAST SURGERY      OB History    No data available       Home Medications    Prior to Admission medications   Medication Sig Start Date End Date Taking? Authorizing Provider  ondansetron (ZOFRAN) 4 MG tablet Take 4 mg by mouth every 8 (eight) hours as needed for nausea or vomiting.    [provider]  OVER THE COUNTER MEDICATION Apply 1 application topically daily as needed. For irritation   Deep roots healing cream    [provider]  ranitidine (ZANTAC) 150 MG tablet Take 1 tablet (150 mg total) by mouth 2 (two) times daily. 11/05/15   McVey, Gelene Mink, PA-C    Family History No family history on file.  Social History Social History  Substance Use Topics  . Smoking status: Current Every Day Smoker  . Smokeless tobacco: Never Used  . Alcohol use Yes     Allergies   Latex   Review of Systems Review of Systems  HENT: Negative for facial swelling (no head inj).   Respiratory: Negative for shortness of breath.   Cardiovascular: Negative for chest pain.  Gastrointestinal: Negative for abdominal pain, nausea and vomiting.  Genitourinary: Negative for difficulty urinating (no incontinence).  Musculoskeletal: Positive for arthralgias, back pain and myalgias. Negative for neck pain.  Skin: Negative for color change and wound.  Allergic/Immunologic: Negative for immunocompromised state.  Neurological: Negative for tingling, syncope, weakness and numbness.  Psychiatric/Behavioral: Negative for confusion.  All other systems reviewed and are negative for acute change except as noted in the HPI.    Physical Exam Updated Vital Signs BP 128/84 (BP Location: Left Arm)   Pulse 60   Temp 98.5 F (36.9 C) (Oral)   Resp 20   SpO2 99%    Physical Exam  Constitutional: She is oriented to person, place, and time. Vital signs are normal. She appears well-developed and well-nourished.  Non-toxic appearance. No distress.  Afebrile, nontoxic, NAD  HENT:  Head: Normocephalic and atraumatic.  Mouth/Throat: Mucous membranes are normal.  Eyes: Conjunctivae and EOM are normal. Right eye exhibits no discharge. Left eye exhibits no discharge.  Neck: Normal range of motion. Neck supple. No spinous process tenderness and no muscular tenderness present. No neck rigidity. Normal range of motion present.  FROM intact without spinous process TTP, no bony stepoffs or deformities, no paraspinous muscle TTP or muscle spasms. No rigidity or meningeal signs. No bruising or swelling.  Cardiovascular: Normal rate and intact distal pulses.   Pulmonary/Chest: Effort normal. No respiratory distress.  Abdominal: Normal appearance. She exhibits no distension.  Musculoskeletal: Normal range of motion.       Right hip: Normal.       Left hip: Normal.       Right knee: Normal.       Left knee: Normal.       Left upper leg: She exhibits tenderness. She exhibits no bony tenderness, no swelling and no deformity.       Right lower leg: She exhibits tenderness. She exhibits no bony tenderness, no swelling and no deformity.  C-spine as above, all other spinal levels nonTTP without bony stepoffs or deformities Left posterior thigh/hamstring and calf with diffuse tenderness in muscular groups, mild spasm of the hamstrings and calf region, but with no focal bony or joint TTP to all areas of the leg. Right calf with mild tenderness but no bony or focal joint TTP. No bruising or swelling, no crepitus or deformity. No limb length discrepancy.  Strength and sensation grossly intact, distal pulses intact, compartments soft. Gait steady.  Neurological: She is alert and oriented to person, place, and time. She has normal strength. No sensory deficit.  Skin: Skin is warm,  dry and intact. No rash noted.  Psychiatric: She has a normal mood and affect. Her behavior is normal.  Nursing note and vitals reviewed.    ED Treatments / Results  DIAGNOSTIC STUDIES:  Oxygen Saturation is 99% on RA, normal by my interpretation.    COORDINATION OF CARE:  1:20 PM Discussed treatment plan with pt at bedside and pt agreed to plan.  Labs (all labs ordered are listed, but only abnormal results are displayed) Labs Reviewed - No data to display  EKG  EKG Interpretation None       Radiology No results found.  Procedures Procedures (including critical care time)  Medications Ordered in ED Medications - No data to display   Initial Impression / Assessment and Plan / ED Course  I have reviewed the triage vital signs and the nursing notes.  Pertinent labs & imaging results that were available during my care of the patient were reviewed by me and considered in my medical decision making (see chart for details).  51 y.o. female here with c/o slip and fall on water today, causing her to land in a split formation, c/o L posterior leg and R posterior calf pain. On exam, mild diffuse L hamstring and calf TTP and mild spasm, and R calf tenderness; no focal bony or joint line TTP to all areas of both lower extremities; no midline spinal tenderness. Lower extremities are neurovascularly intact and patient is ambulating without difficulty. Doubt need for imaging/labs, likely muscular strain. Discussed ice/heat use. Rx given for muscle relaxer and counseled on proper use of muscle relaxant medication. Urged patient not to drink alcohol, drive, or perform any other activities that requires focus while taking these medications. Rx for naprosyn given. Advised tylenol use as well.  Advised f/up with PCP in 1wk for recheck of symptoms. I explained the diagnosis and have given explicit precautions to return to the ER including for any other new or worsening symptoms. The patient  understands and accepts the medical plan as it's been dictated and I have answered their questions. Discharge instructions concerning home care and prescriptions have been given. The patient is STABLE and is discharged to home in good condition.   I personally performed the services described in this documentation, which was scribed in my presence. The recorded information has been reviewed and is accurate.   Final Clinical Impressions(s) / ED Diagnoses   Final diagnoses:  Left hamstring muscle strain, initial encounter  Strain of calf muscle, right, initial encounter  Fall, initial encounter  Muscle spasm of both lower legs    New Prescriptions New Prescriptions   CYCLOBENZAPRINE (FLEXERIL) 10 MG TABLET    Take 1 tablet (10 mg total) by mouth 3 (three) times daily as needed for muscle spasms.   NAPROXEN (NAPROSYN) 500 MG TABLET    Take 1 tablet (500 mg total) by mouth 2 (two) times daily as needed for mild pain, moderate pain or headache (TAKE WITH MEALS.).     [address], [place], [place] 09/23/16 1337    Dorie Rank, MD 09/23/16 [phone]

## 2016-10-23 ENCOUNTER — Ambulatory Visit (INDEPENDENT_AMBULATORY_CARE_PROVIDER_SITE_OTHER): Payer: BLUE CROSS/BLUE SHIELD | Admitting: Family

## 2016-10-23 ENCOUNTER — Encounter (INDEPENDENT_AMBULATORY_CARE_PROVIDER_SITE_OTHER): Payer: Self-pay | Admitting: Family

## 2016-10-23 ENCOUNTER — Ambulatory Visit (INDEPENDENT_AMBULATORY_CARE_PROVIDER_SITE_OTHER): Payer: Self-pay

## 2016-10-23 VITALS — Ht 64.0 in | Wt 231.0 lb

## 2016-10-23 DIAGNOSIS — M25561 Pain in right knee: Secondary | ICD-10-CM | POA: Diagnosis not present

## 2016-10-23 DIAGNOSIS — M25571 Pain in right ankle and joints of right foot: Secondary | ICD-10-CM

## 2016-10-23 DIAGNOSIS — M25572 Pain in left ankle and joints of left foot: Secondary | ICD-10-CM | POA: Diagnosis not present

## 2016-10-23 DIAGNOSIS — G8929 Other chronic pain: Secondary | ICD-10-CM | POA: Diagnosis not present

## 2016-10-23 NOTE — Progress Notes (Signed)
Office Visit Note   Patient: Laurie French           Date of Birth: 30-Nov-1965           MRN: [id number] Visit Date: 10/23/2016              Requested by: No referring provider defined for this encounter. PCP: System, Provider Not In  Chief Complaint  Patient presents with  . Right Knee - Pain    1 month s/p slip and fall in puddle of water  at [facility]       HPI: The patient is a 51 year old woman who presents today complaining of continued right knee pain as well as bilateral ankle pain following a slip and fall at [facility]. She fell with her left leg extended in a position. Is continuing to have bilateral ankle pain she feels that her swelling or feels very unstable with ambulation. Ankle pain is poorly localized. Today patient in flip-flops ambulating easily. The right knee is painful globally. Complains of worse pain with ambulation and weightbearing is unsure whether she twisted her knee does request an MRI.  Assessment & Plan: Visit Diagnoses:  1. Chronic pain of right knee     Plan: Him provided an ASO for her left ankle which is the most painful and she feels most unstable. She was also given a knee brace as well will order MRI of the knee and follow-up with her in the office to discuss the results. Continue with ibuprofen or Aleve for pain.  Follow-Up Instructions: Return for mri review.   Right Ankle Exam  Swelling: mild  Tenderness  Right ankle tenderness location: global.    Range of Motion  The patient has normal right ankle ROM.  Tests  Anterior drawer: negative Varus tilt: negative  Other  Erythema: absent    Left Ankle Exam  Swelling: mild  Tenderness  Left ankle tenderness location: global.   Range of Motion  The patient has normal left ankle ROM.   Tests  Anterior drawer: negative Varus tilt: negative  Other  Erythema: absent   Right Knee Exam   Tenderness  The patient is experiencing tenderness in the medial joint line,  lateral joint line and patella.  Range of Motion  The patient has normal right knee ROM.  Tests  Drawer:       Anterior - negative    Posterior - negative Varus: negative Valgus: negative  Other  Erythema: absent Swelling: mild Other tests: no effusion present  Comments:  Poor exam, patient resists      Patient is alert, oriented, no adenopathy, well-dressed, normal affect, normal respiratory effort.   Imaging: Xr Knee 1-2 Views Right  Result Date: 10/23/2016 Radiographs of right knee unremarkable. No fracture. Very mild medial narrowing.    Labs: No results found for: HGBA1C, ESRSEDRATE, CRP, LABURIC, REPTSTATUS, GRAMSTAIN, CULT, LABORGA  Orders:  Orders Placed This Encounter  Procedures  . XR Knee 1-2 Views Right  . MR Knee Right w/o contrast   No orders of the defined types were placed in this encounter.    Procedures: No procedures performed  Clinical Data: No additional findings.  ROS:  All other systems negative, except as noted in the HPI. Review of Systems  Constitutional: Negative for chills and fever.  Cardiovascular: Negative for leg swelling.  Musculoskeletal: Positive for arthralgias, joint swelling and myalgias.  Neurological: Negative for weakness.    Objective: Vital Signs: Ht 5\' 4"  (1.626 m)   Wt  231 lb (104.8 kg)   BMI 39.65 kg/m   Specialty Comments:  No specialty comments available.  PMFS History: Patient Active Problem List   Diagnosis Date Noted  . Breast cancer ([place]) 11/05/2015   Past Medical History:  Diagnosis Date  . Cancer ([place])   . Sickle cell anemia ([facility])     No family history on file.  Past Surgical History:  Procedure Laterality Date  . ABDOMINAL HYSTERECTOMY    . BREAST SURGERY     Social History   Occupational History  . Not on file.   Social History Main Topics  . Smoking status: Current Every Day Smoker  . Smokeless tobacco: Never Used  . Alcohol use Yes  . Drug use: No  . Sexual activity:  Yes

## 2016-11-16 ENCOUNTER — Ambulatory Visit
Admission: RE | Admit: 2016-11-16 | Discharge: 2016-11-16 | Disposition: A | Discharge status: Home / Self Care | Payer: BLUE CROSS/BLUE SHIELD | Source: Ambulatory Visit | Attending: Family | Admitting: Family

## 2016-11-16 DIAGNOSIS — S83231A Complex tear of medial meniscus, current injury, right knee, initial encounter: Secondary | ICD-10-CM | POA: Diagnosis not present

## 2016-11-16 DIAGNOSIS — G8929 Other chronic pain: Secondary | ICD-10-CM

## 2016-11-16 DIAGNOSIS — M25561 Pain in right knee: Principal | ICD-10-CM

## 2016-11-19 ENCOUNTER — Ambulatory Visit (INDEPENDENT_AMBULATORY_CARE_PROVIDER_SITE_OTHER): Payer: BLUE CROSS/BLUE SHIELD | Admitting: Orthopedic Surgery

## 2016-11-19 ENCOUNTER — Encounter (INDEPENDENT_AMBULATORY_CARE_PROVIDER_SITE_OTHER): Payer: Self-pay | Admitting: Orthopedic Surgery

## 2016-11-19 DIAGNOSIS — G8929 Other chronic pain: Secondary | ICD-10-CM

## 2016-11-19 DIAGNOSIS — M25561 Pain in right knee: Secondary | ICD-10-CM

## 2016-11-19 NOTE — Progress Notes (Signed)
Office Visit Note   Patient: Laurie French           Date of Birth: 05-11-1965           MRN: [id number] Visit Date: 11/19/2016              Requested by: No referring provider defined for this encounter. PCP: System, Provider Not In  Chief Complaint  Patient presents with  . Right Knee - Follow-up    Mri review right knee      HPI: Patient is a 51 year old woman who is seen in follow-up status post MRI scan of her right knee. Patient states that she slipped while in [place] and sustained the injury to her knee. Patient states that she limps has pain with activities of daily living. She complains of mechanical symptoms with her knee giving out. Discussed that we could proceed with arthroscopic intervention. Discussed with the arthroscopy should provide good relief of the meniscal tear but most likely would not provide much relief for the tricompartmental arthritis. Risks and benefits of surgery were discussed patient states she understands she states she does have [employer] and will check on her insurance co-pay prior to proceeding with surgery. Would plan for outpatient surgery at the orthopedic surgical Center arthroscopy of the right knee.  Assessment & Plan: Visit Diagnoses:  1. Chronic pain of right knee     Plan: Discussed that we could proceed with arthroscopic intervention. Discussed with the arthroscopy should provide good relief of the meniscal tear but most likely would not provide much relief for the tricompartmental arthritis. Risks and benefits of surgery were discussed patient states she understands she states she does have [employer] and will check on her insurance co-pay prior to proceeding with surgery. Would plan for outpatient surgery at the orthopedic surgical Center arthroscopy of the right knee.    Follow-Up Instructions: Return if symptoms worsen or fail to improve.   Ortho Exam  Patient is alert, oriented, no adenopathy,  well-dressed, normal affect, normal respiratory effort. Patient has difficulty getting from a sitting to a standing position she has an antalgic gait. There is no effusion there is tenderness to palpation over the medial lateral joint line as well as the patellofemoral joint. Flexor and extension re-creates some mild crepitation collaterals and cruciates are stable.  Review the MRI scan shows a large degenerative tear of the medial meniscus as well as tricompartmental arthritic changes.  Imaging: No results found. No images are attached to the encounter.  Labs: No results found for: HGBA1C, ESRSEDRATE, CRP, LABURIC, REPTSTATUS, GRAMSTAIN, CULT, LABORGA  Orders:  No orders of the defined types were placed in this encounter.  No orders of the defined types were placed in this encounter.    Procedures: No procedures performed  Clinical Data: No additional findings.  ROS:  All other systems negative, except as noted in the HPI. Review of Systems  Objective: Vital Signs: There were no vitals taken for this visit.  Specialty Comments:  No specialty comments available.  PMFS History: Patient Active Problem List   Diagnosis Date Noted  . Breast cancer ([place]) 11/05/2015   Past Medical History:  Diagnosis Date  . Cancer ([place])   . Sickle cell anemia ([facility])     History reviewed. No pertinent family history.  Past Surgical History:  Procedure Laterality Date  . ABDOMINAL HYSTERECTOMY    . BREAST SURGERY     Social History   Occupational History  . Not  on file.   Social History Main Topics  . Smoking status: Current Every Day Smoker  . Smokeless tobacco: Never Used  . Alcohol use Yes  . Drug use: No  . Sexual activity: Yes

## 2017-01-04 DIAGNOSIS — C50911 Malignant neoplasm of unspecified site of right female breast: Secondary | ICD-10-CM | POA: Diagnosis not present

## 2017-01-04 DIAGNOSIS — Z452 Encounter for adjustment and management of vascular access device: Secondary | ICD-10-CM | POA: Diagnosis not present

## 2017-04-27 ENCOUNTER — Other Ambulatory Visit (INDEPENDENT_AMBULATORY_CARE_PROVIDER_SITE_OTHER): Payer: Self-pay | Admitting: Physician Assistant

## 2017-04-27 ENCOUNTER — Encounter: Payer: Self-pay | Admitting: Orthopedic Surgery

## 2017-04-27 DIAGNOSIS — G8918 Other acute postprocedural pain: Secondary | ICD-10-CM | POA: Diagnosis not present

## 2017-04-27 DIAGNOSIS — M23241 Derangement of anterior horn of lateral meniscus due to old tear or injury, right knee: Secondary | ICD-10-CM | POA: Diagnosis not present

## 2017-04-27 DIAGNOSIS — S83211A Bucket-handle tear of medial meniscus, current injury, right knee, initial encounter: Secondary | ICD-10-CM | POA: Diagnosis not present

## 2017-04-27 DIAGNOSIS — M23221 Derangement of posterior horn of medial meniscus due to old tear or injury, right knee: Secondary | ICD-10-CM | POA: Diagnosis not present

## 2017-04-27 DIAGNOSIS — M1711 Unilateral primary osteoarthritis, right knee: Secondary | ICD-10-CM | POA: Diagnosis not present

## 2017-04-27 DIAGNOSIS — S83261A Peripheral tear of lateral meniscus, current injury, right knee, initial encounter: Secondary | ICD-10-CM | POA: Diagnosis not present

## 2017-04-27 MED ORDER — CYCLOBENZAPRINE HCL 10 MG PO TABS: 10.0000 mg | ORAL_TABLET | Freq: Three times a day (TID) | ORAL | 0 refills | Status: DC | PRN

## 2017-04-28 ENCOUNTER — Other Ambulatory Visit (INDEPENDENT_AMBULATORY_CARE_PROVIDER_SITE_OTHER): Payer: Self-pay | Admitting: Family

## 2017-04-28 ENCOUNTER — Telehealth (INDEPENDENT_AMBULATORY_CARE_PROVIDER_SITE_OTHER): Payer: Self-pay | Admitting: Orthopedic Surgery

## 2017-04-28 MED ORDER — CYCLOBENZAPRINE HCL 10 MG PO TABS: 10.0000 mg | ORAL_TABLET | Freq: Three times a day (TID) | ORAL | 0 refills | Status: DC | PRN

## 2017-04-28 NOTE — Telephone Encounter (Signed)
Patient called triage line extremely upset because she was waiting at the pharmacy for her prescription. I advised per this message, refill on flexeril was not appropriate. Patient was upset and stated she had no idea what I was talking about and that she did not ask for a refill. Patient stated that she had knee surgery and that she does not take pain medication and something like a muscle relaxer would be called in for her. Surgery was at [facility] so no notes were available in the chart. After pulling OP note from website, I spoke with Dondra Prader, NP who electronically sent in Flexeril to [facility] at [facility] where patient was. I expressed how sorry that I was that there was a mix up in the messages received.  Patient waiting to pick up Flexeril at pharmacy now.

## 2017-04-28 NOTE — Telephone Encounter (Signed)
Cyclobenzaprine (Flexeril) 10mg  tablet

## 2017-04-28 NOTE — Telephone Encounter (Signed)
For you to document

## 2017-05-06 ENCOUNTER — Ambulatory Visit (INDEPENDENT_AMBULATORY_CARE_PROVIDER_SITE_OTHER): Payer: BLUE CROSS/BLUE SHIELD | Admitting: Orthopedic Surgery

## 2017-05-06 ENCOUNTER — Encounter (INDEPENDENT_AMBULATORY_CARE_PROVIDER_SITE_OTHER): Payer: Self-pay | Admitting: Orthopedic Surgery

## 2017-05-06 VITALS — Ht 64.0 in | Wt 231.0 lb

## 2017-05-06 DIAGNOSIS — M23321 Other meniscus derangements, posterior horn of medial meniscus, right knee: Secondary | ICD-10-CM

## 2017-05-06 NOTE — Progress Notes (Signed)
   Post-Op Visit Note   Patient: Laurie French           Date of Birth: 05-Jul-1965           MRN: [id number] Visit Date: 05/06/2017 PCP: System, Provider Not In  Chief Complaint:  Chief Complaint  Patient presents with  . Right Knee - Routine Post Op    04/27/17 right knee scope    HPI:  HPI Patient is a 52 year old woman seen status post arthroscopy for Complex tear of the medial meniscus posterior horn, with a predominant longitudinal component and a radial component near the posterior root on 04/27/17. Is doing well. Walking and doing activities in home with no issues.   Ortho Exam The portals are clean and dry. No erythema. Mild tenderness to knee globally. Mild swelling. No sign of infection.  Visit Diagnoses: No diagnosis found.  Plan: follow up in office in 2-3 weeks as needed. Advance activities as tolerated. Work on [employer]. Will send to PT.   Follow-Up Instructions: No Follow-up on file.   Imaging: No results found.  Orders:  No orders of the defined types were placed in this encounter.  No orders of the defined types were placed in this encounter.    PMFS History: Patient Active Problem List   Diagnosis Date Noted  . Breast cancer ([place]) 11/05/2015   Past Medical History:  Diagnosis Date  . Cancer ([place])   . Sickle cell anemia (HCC)     No family history on file.  Past Surgical History:  Procedure Laterality Date  . ABDOMINAL HYSTERECTOMY    . BREAST SURGERY     Social History   Occupational History  . Not on file  Tobacco Use  . Smoking status: Current Every Day Smoker  . Smokeless tobacco: Never Used  Substance and Sexual Activity  . Alcohol use: Yes  . Drug use: No  . Sexual activity: Yes

## 2017-05-11 ENCOUNTER — Other Ambulatory Visit: Payer: Self-pay

## 2017-05-11 ENCOUNTER — Ambulatory Visit: Payer: BLUE CROSS/BLUE SHIELD | Attending: Family

## 2017-05-11 DIAGNOSIS — M6281 Muscle weakness (generalized): Secondary | ICD-10-CM | POA: Diagnosis not present

## 2017-05-11 DIAGNOSIS — M25561 Pain in right knee: Secondary | ICD-10-CM | POA: Diagnosis not present

## 2017-05-11 DIAGNOSIS — M25661 Stiffness of right knee, not elsewhere classified: Secondary | ICD-10-CM | POA: Diagnosis not present

## 2017-05-11 DIAGNOSIS — R600 Localized edema: Secondary | ICD-10-CM | POA: Insufficient documentation

## 2017-05-11 DIAGNOSIS — R2689 Other abnormalities of gait and mobility: Secondary | ICD-10-CM | POA: Insufficient documentation

## 2017-05-11 NOTE — Therapy (Signed)
[facility] [address], [place], [place], [address] Phone: [phone]   Fax:  [phone]  Physical Therapy Treatment  Patient Details  Name: Laurie French MRN: [id number] Date of Birth: Feb 20, 1966 Referring Provider: Maryan Puls, MD   Encounter Date: 05/11/2017  PT End of Session - 05/11/17 1232    Visit Number  1    Date for PT Re-Evaluation  07/06/17    Authorization Type  BCBS- 30 visit limit    Authorization - Visit Number  1    Authorization - Number of Visits  30    PT Start Time  4270    PT Stop Time  1226    PT Time Calculation (min)  37 min    Activity Tolerance  Patient tolerated treatment well    Behavior During Therapy  [facility] for tasks assessed/performed       Past Medical History:  Diagnosis Date  . Cancer ([place])   . Sickle cell anemia (HCC)     Past Surgical History:  Procedure Laterality Date  . ABDOMINAL HYSTERECTOMY    . BREAST SURGERY      There were no vitals filed for this visit.  Subjective Assessment - 05/11/17 1144    Subjective  Pt presents to PT s/p Rt knee arthroscopy 04/27/17 secondary to complex tear of the medial meniscus.  Pt had a fall in [place] in July causing injury to the Rt knee.      Pertinent History  Rt knee arthroscopy: 04/27/17 complex tear of medial meniscus.  Breast cancer- 2018 with lymph node removal.    Limitations  Walking;Standing    How long can you stand comfortably?  1 hour    How long can you walk comfortably?  1 hour    Patient Stated Goals  symmetry with gait, reduce stiffness, improve strength and endurance    Currently in Pain?  Yes    Pain Score  1     Pain Location  Knee    Pain Orientation  Right    Pain Descriptors / Indicators  Tightness;Aching    Pain Type  Surgical pain    Pain Onset  More than a month ago    Pain Frequency  Constant    Aggravating Factors   unknown-pt not able to determine    Pain Relieving Factors  ice,  rest, elevation         [facility] PT Assessment - 05/11/17 0001      Assessment   Medical Diagnosis  other meniscus derangements, posterior horn of medial meniscus, Rt knee.  s/p Rt knee arthroscopy    Referring Provider  Maryan Puls, MD    Onset Date/Surgical Date  04/27/17    Next MD Visit  unknown      Precautions   Precautions  Other (comment)    Precaution Comments  history of breast cancer- no [place] or arm bike      Restrictions   Weight Bearing Restrictions  No      Balance Screen   Has the patient fallen in the past 6 months  No    Has the patient had a decrease in activity level because of a fear of falling?   No    Is the patient reluctant to leave their home because of a fear of falling?   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, advertising    Leisure  none      Cognition   Overall Cognitive Status  Within [place] for tasks assessed      Observation/Other Assessments   Focus on Therapeutic Outcomes (FOTO)   53% limitation      Observation/Other Assessments-Edema    Edema  Circumferential      Circumferential Edema   Circumferential - Right  19 inches at midpatella- pocket of edema at distal Rt quad    Circumferential - Left   19 inches at midpatella      Posture/Postural Control   Posture/Postural Control  Postural limitations    Posture Comments  genu varus bilaterally      ROM / Strength   AROM / PROM / Strength  AROM;PROM;Strength      AROM   Overall AROM   Deficits    Overall AROM Comments  Lt knee A/ROM is 0-110 degrees    AROM Assessment Site  Knee    Right/Left Knee  Right    Right Knee Extension  8    Right  Knee Flexion  90      Strength   Overall Strength  Deficits    Overall Strength Comments  poor quad set on the Rt    Strength Assessment Site  Knee;Hip;Ankle    Right/Left Hip  Right;Left    Right Hip Flexion  4-/5    Right Hip ABduction  4/5    Left Hip Flexion  4+/5    Left Hip ABduction  4+/5    Right/Left Knee  Left;Right    Right Knee Flexion  4-/5    Right Knee Extension  4-/5    Left Knee Flexion  4+/5    Left Knee Extension  5/5    Right/Left Ankle  Right;Left    Right Ankle Dorsiflexion  5/5    Left Ankle Dorsiflexion  5/5      Palpation   Patella mobility  reduced superior/inferior patellar mobility on the Rt    Palpation comment  Pt with well healing arthroscopic incisions. Pocket of edema proximal to Rt patella.        Transfers   Transfers  Sit to Stand;Stand to Sit    Sit to Stand  With upper extremity assist    Stand to Sit  With upper extremity assist      Ambulation/Gait   Ambulation/Gait  Yes    Assistive device  None    Gait Pattern  Step-through pattern;Decreased step length - right;Decreased stance time - right;Decreased dorsiflexion - right;Decreased weight shift to right    Stairs  Yes    Stairs Assistance  6: Modified independent (Device/Increase time)    Stair Management Technique  One rail Right;Step to pattern                          PT Education - 05/11/17 1221    Education provided  Yes    Education Details  quad strength, knee flexibility    Person(s) Educated  Patient    Methods  Explanation;Demonstration;Handout    Comprehension  Verbalized understanding;Returned demonstration  PT Short Term Goals - 05/11/17 1237      PT SHORT TERM GOAL #1   Title  be independent in initial HEP    Time  4    Period  Weeks    Status  New    Target Date  06/08/17      PT SHORT TERM GOAL #2   Title  improve Rt LE strength to ascend steps into her house with step-over-step gait    Time  4    Period  Weeks    Status  New     Target Date  06/08/17      PT SHORT TERM GOAL #3   Title  demonstrate symmetry with ambulation on level surface    Time  4    Status  New    Target Date  06/08/17        PT Long Term Goals - 05/11/17 1238      PT LONG TERM GOAL #1   Title  be independent in advanced HEP    Time  8    Period  Weeks    Status  New    Target Date  07/06/17      PT LONG TERM GOAL #2   Title  demonstrate Rt knee A/ROM flexion to > or = to 110 degrees to improve squatting and descending steps    Time  8    Period  Weeks    Status  New    Target Date  07/06/17      PT LONG TERM GOAL #3   Title  demonstrate 4+/5 Rt hip and knee strength to improve endurance and stability    Time  8    Period  Weeks    Status  New    Target Date  07/06/17      PT LONG TERM GOAL #4   Title  improve Rt LE strength to ascend and descend steps with step-over-step gait pattern    Time  8    Period  Weeks    Status  New    Target Date  07/06/17      PT LONG TERM GOAL #5   Title  reduce FOTO to < or = to 37% limitation    Time  8    Period  Weeks    Target Date  07/06/17            Plan - 05/11/17 1232    Clinical Impression Statement  Pt presents to PT s/p Rt knee arthroscopy performed 04/27/17 due to complex tear of the medial meniscus.  Pt had a fall in July resulting in this injury.  Pt demonstrates antalgic gait, edema at Rt distal quad, Rt hip and knee weakness, limited A/ROM and poor quad set on the Rt.  Pt negotiates steps with step-to gait pattern.  Pt will benefit from skilled PT for strength, ROM and edema management s/p knee arthroscopy to improve endurance, normalize gait and stability at home and in the community.      History and Personal Factors relevant to plan of care:  history of breast cancer    Clinical Presentation  Stable    Clinical Decision Making  Low    Rehab Potential  Good    Clinical Impairments Affecting Rehab Potential  none    PT Frequency  2x / week    PT Duration  8  weeks    PT Treatment/Interventions  ADLs/Self Care Home Management;Cryotherapy;Electrical Stimulation;Moist Heat;Therapeutic exercise;Therapeutic activities;Functional mobility  training;Stair training;Gait training;Neuromuscular re-education;Patient/family education;Passive range of motion;Taping;Vasopneumatic Device    PT Next Visit Plan  Rt knee and hip strength and A/ROM progression, gait, edema management with game ready    Consulted and Agree with Plan of Care  Patient       Patient will benefit from skilled therapeutic intervention in order to improve the following deficits and impairments:  Abnormal gait, Pain, Decreased activity tolerance, Decreased range of motion, Decreased strength, Increased edema, Difficulty walking, Impaired flexibility  Visit Diagnosis: Acute pain of right knee - Plan: PT plan of care cert/re-cert  Muscle weakness (generalized) - Plan: PT plan of care cert/re-cert  Stiffness of right knee, not elsewhere classified - Plan: PT plan of care cert/re-cert  Localized edema - Plan: PT plan of care cert/re-cert  Other abnormalities of gait and mobility - Plan: PT plan of care cert/re-cert     Problem List Patient Active Problem List   Diagnosis Date Noted  . Breast cancer ([place]) 11/05/2015    Sigurd Sos, PT 05/11/17 12:43 PM  [facility] [address] [place], [place], [address] Phone: [phone]   Fax:  [phone]  Name: BETTYLEE FEIG MRN: [id number] Date of Birth: [birth year]

## 2017-05-11 NOTE — Patient Instructions (Addendum)
  KNEE: Extension, Long Arc Quads - Sitting    Raise leg until knee is straight.  Hold 5 seconds. _10__ reps per set, ___ sets per day, ___ days per week  Copyright  VHI. All rights reserved.    Quad Set   With other leg bent, foot flat, slowly tighten muscles on thigh of straight leg while counting out loud to _5___. Repeat _10-20___ times. Do __5Hip Flexion / Knee Extension: Straight-Leg Raise (Eccentric)   Lie on back. Lift leg with knee straight. Slowly lower leg for 3-5 seconds. ___10 reps per set, 20__ sets per day, _7__ days per week. Lower like elevator, stopping at each floor. Heel Slides   Use a strap or towel to Slide left heel along bed towards bottom. Hold for _5-10__ seconds. Perform gently. Slide back to flat knee position. Repeat 10___ times. Do _3__ times a day.

## 2017-05-14 ENCOUNTER — Encounter: Payer: Self-pay | Admitting: Physical Therapy

## 2017-05-14 ENCOUNTER — Ambulatory Visit: Payer: BLUE CROSS/BLUE SHIELD | Admitting: Physical Therapy

## 2017-05-14 DIAGNOSIS — M6281 Muscle weakness (generalized): Secondary | ICD-10-CM | POA: Diagnosis not present

## 2017-05-14 DIAGNOSIS — M25561 Pain in right knee: Secondary | ICD-10-CM

## 2017-05-14 DIAGNOSIS — R600 Localized edema: Secondary | ICD-10-CM

## 2017-05-14 DIAGNOSIS — M25661 Stiffness of right knee, not elsewhere classified: Secondary | ICD-10-CM | POA: Diagnosis not present

## 2017-05-14 DIAGNOSIS — R2689 Other abnormalities of gait and mobility: Secondary | ICD-10-CM

## 2017-05-14 NOTE — Therapy (Signed)
[facility] [address] [place], [place], [address] Phone: [phone]   Fax:  [phone]  Physical Therapy Treatment  Patient Details  Name: Laurie French MRN: [id number] Date of Birth: 1965-12-07 Referring Provider: Maryan Puls, MD   Encounter Date: 05/14/2017  PT End of Session - 05/14/17 0835    Visit Number  2    Date for PT Re-Evaluation  07/06/17    Authorization Type  BCBS- 30 visit limit    Authorization - Visit Number  2    Authorization - Number of Visits  30    PT Start Time  [id number]    PT Stop Time  0925    PT Time Calculation (min)  52 min    Activity Tolerance  Patient tolerated treatment well    Behavior During Therapy  [facility] for tasks assessed/performed       Past Medical History:  Diagnosis Date  . Cancer ([place])   . Sickle cell anemia (HCC)     Past Surgical History:  Procedure Laterality Date  . ABDOMINAL HYSTERECTOMY    . BREAST SURGERY      There were no vitals filed for this visit.  Subjective Assessment - 05/14/17 0837    Subjective  Pt presents with visually significant edema and moderate pain. Pt walks into clinic with altalgic gait, knee semi-flexed.    Pertinent History  Rt knee arthroscopy: 04/27/17 complex tear of medial meniscus.  Breast cancer- 2018 with lymph node removal.    Currently in Pain?  Yes    Pain Score  4     Pain Location  Knee    Pain Orientation  Right    Pain Descriptors / Indicators  Sore    Aggravating Factors   being up on it a lot    Pain Relieving Factors  her mind    Multiple Pain Sites  No         [facility] PT Assessment - 05/14/17 0001      AROM   Right Knee Flexion  102                  [facility] Adult PT Treatment/Exercise - 05/14/17 0001      Knee/Hip Exercises: Aerobic   Nustep  L1 x 5 min PTA present to update status      Knee/Hip Exercises: Supine   Quad Sets  -- 5 sec hold 10x    Short Arc Quad Sets   AROM;Strengthening;Right;2 sets;10 reps    Heel Slides  AROM;Right;1 set;20 reps      Vasopneumatic   Number Minutes Vasopneumatic   15 minutes    Vasopnuematic Location   Knee    Vasopneumatic Pressure  Medium    Vasopneumatic Temperature   3 flakes elevated      Manual Therapy   Edema Management  Retrograde massage               PT Short Term Goals - 05/11/17 1237      PT SHORT TERM GOAL #1   Title  be independent in initial HEP    Time  4    Period  Weeks    Status  New    Target Date  06/08/17      PT SHORT TERM GOAL #2   Title  improve Rt LE strength to ascend steps into her house with step-over-step gait    Time  4    Period  Weeks  Status  New    Target Date  06/08/17      PT SHORT TERM GOAL #3   Title  demonstrate symmetry with ambulation on level surface    Time  4    Status  New    Target Date  06/08/17        PT Long Term Goals - 05/11/17 1238      PT LONG TERM GOAL #1   Title  be independent in advanced HEP    Time  8    Period  Weeks    Status  New    Target Date  07/06/17      PT LONG TERM GOAL #2   Title  demonstrate Rt knee A/ROM flexion to > or = to 110 degrees to improve squatting and descending steps    Time  8    Period  Weeks    Status  New    Target Date  07/06/17      PT LONG TERM GOAL #3   Title  demonstrate 4+/5 Rt hip and knee strength to improve endurance and stability    Time  8    Period  Weeks    Status  New    Target Date  07/06/17      PT LONG TERM GOAL #4   Title  improve Rt LE strength to ascend and descend steps with step-over-step gait pattern    Time  8    Period  Weeks    Status  New    Target Date  07/06/17      PT LONG TERM GOAL #5   Title  reduce FOTO to < or = to 37% limitation    Time  8    Period  Weeks    Target Date  07/06/17            Plan - 05/14/17 0836    Clinical Impression Statement  Pt presents with knee swelling and soreness. Pt's attitude towards her recovery/current  situation is tremendously positive and encouraging. Proximal gastroc tight, improved with manual work. Performed retrograde massage and some lymphatic massage. Between the manual work and [place] edema appeared much improved at the end of her session including a knee flexion AROM measurement of 102 degrees. Anticipate pt will tolerate more exercise including weight bearing next session.     Rehab Potential  Good    Clinical Impairments Affecting Rehab Potential  none    PT Frequency  2x / week    PT Duration  8 weeks    PT Treatment/Interventions  ADLs/Self Care Home Management;Cryotherapy;Electrical Stimulation;Moist Heat;Therapeutic exercise;Therapeutic activities;Functional mobility training;Stair training;Gait training;Neuromuscular re-education;Patient/family education;Passive range of motion;Taping;Vasopneumatic Device    PT Next Visit Plan  Edema management, weight shifting on the mini tramp, gastroc stretching, Nustep, other hip strength    Consulted and Agree with Plan of Care  Patient       Patient will benefit from skilled therapeutic intervention in order to improve the following deficits and impairments:  Abnormal gait, Pain, Decreased activity tolerance, Decreased range of motion, Decreased strength, Increased edema, Difficulty walking, Impaired flexibility  Visit Diagnosis: Acute pain of right knee  Muscle weakness (generalized)  Stiffness of right knee, not elsewhere classified  Localized edema  Other abnormalities of gait and mobility     Problem List Patient Active Problem List   Diagnosis Date Noted  . Breast cancer ([place]) 11/05/2015    Kiyonna Tortorelli, PTA 05/14/2017, 9:17 AM  [facility] [address] [place], [place], [address] Phone: [phone]   Fax:  [phone]  Name: SHARRELL KRAWIEC MRN: [id number] Date of Birth: 1966-01-11

## 2017-05-17 ENCOUNTER — Ambulatory Visit: Payer: BLUE CROSS/BLUE SHIELD | Admitting: Physical Therapy

## 2017-05-17 ENCOUNTER — Encounter: Payer: Self-pay | Admitting: Physical Therapy

## 2017-05-17 DIAGNOSIS — R2689 Other abnormalities of gait and mobility: Secondary | ICD-10-CM | POA: Diagnosis not present

## 2017-05-17 DIAGNOSIS — M25661 Stiffness of right knee, not elsewhere classified: Secondary | ICD-10-CM

## 2017-05-17 DIAGNOSIS — M25561 Pain in right knee: Secondary | ICD-10-CM | POA: Diagnosis not present

## 2017-05-17 DIAGNOSIS — M6281 Muscle weakness (generalized): Secondary | ICD-10-CM | POA: Diagnosis not present

## 2017-05-17 DIAGNOSIS — R600 Localized edema: Secondary | ICD-10-CM | POA: Diagnosis not present

## 2017-05-17 NOTE — Therapy (Signed)
[facility] [address] [place], [place], [address] Phone: [phone]   Fax:  [phone]  Physical Therapy Treatment  Patient Details  Name: Laurie French MRN: [id number] Date of Birth: [birth year] Referring Provider: Maryan Puls, MD   Encounter Date: 05/17/2017  PT End of Session - 05/17/17 0853    Visit Number  3    Date for PT Re-Evaluation  07/06/17    Authorization Type  BCBS- 30 visit limit    Authorization - Visit Number  3    Authorization - Number of Visits  30    PT Start Time  [phone]    PT Stop Time  0945    PT Time Calculation (min)  54 min    Activity Tolerance  Patient tolerated treatment well    Behavior During Therapy  [facility] for tasks assessed/performed       Past Medical History:  Diagnosis Date  . Cancer ([place])   . Sickle cell anemia (HCC)     Past Surgical History:  Procedure Laterality Date  . ABDOMINAL HYSTERECTOMY    . BREAST SURGERY      There were no vitals filed for this visit.  Subjective Assessment - 05/17/17 0854    Subjective  Felt good after last session. No pain this AM. Swelling much improved since last session.     Pertinent History  Rt knee arthroscopy: 04/27/17 complex tear of medial meniscus.  Breast cancer- 2018 with lymph node removal.    Limitations  Walking;Standing    Currently in Pain?  No/denies    Multiple Pain Sites  No                      [facility] Adult PT Treatment/Exercise - 05/17/17 0001      Knee/Hip Exercises: Aerobic   Nustep  L1 x 6 min Pillow for lumbar      Knee/Hip Exercises: Standing   Hip Abduction  AROM;Stengthening;Both;1 set;10 reps;Knee straight    Rebounder  weight shifting 1 min 3 ways Monitor for pain      Knee/Hip Exercises: Seated   Ball Squeeze  10x concurrent with gluteals 3-5 sec      Knee/Hip Exercises: Supine   Short Arc Quad Sets  Strengthening;Right;2 sets;10 reps added 1# wt    Straight Leg  Raises  Strengthening;Right;2 sets;10 reps 0#      Vasopneumatic   Number Minutes Vasopneumatic   15 minutes    Vasopnuematic Location   Knee    Vasopneumatic Pressure  Medium    Vasopneumatic Temperature   3 flakes             PT Education - 05/17/17 0915    Education provided  Yes    Education Details  SLR to quad set    Person(s) Educated  Patient    Methods  Explanation;Demonstration;Tactile cues;Verbal cues    Comprehension  Returned demonstration;Verbalized understanding       PT Short Term Goals - 05/17/17 0907      PT SHORT TERM GOAL #1   Title  be independent in initial HEP    Time  4    Period  Weeks    Status  Achieved      PT SHORT TERM GOAL #3   Title  demonstrate symmetry with ambulation on level surface    Time  4    Status  On-going        PT Long Term  Goals - 05/11/17 1238      PT LONG TERM GOAL #1   Title  be independent in advanced HEP    Time  8    Period  Weeks    Status  New    Target Date  07/06/17      PT LONG TERM GOAL #2   Title  demonstrate Rt knee A/ROM flexion to > or = to 110 degrees to improve squatting and descending steps    Time  8    Period  Weeks    Status  New    Target Date  07/06/17      PT LONG TERM GOAL #3   Title  demonstrate 4+/5 Rt hip and knee strength to improve endurance and stability    Time  8    Period  Weeks    Status  New    Target Date  07/06/17      PT LONG TERM GOAL #4   Title  improve Rt LE strength to ascend and descend steps with step-over-step gait pattern    Time  8    Period  Weeks    Status  New    Target Date  07/06/17      PT LONG TERM GOAL #5   Title  reduce FOTO to < or = to 37% limitation    Time  8    Period  Weeks    Target Date  07/06/17            Plan - 05/17/17 0853    Clinical Impression Statement  Pt presents today without pain and lessening edema. Pt tolerated initiation of standing exercises and light weights for quad strength. All tolerated well but  difficult to do. Pt was monitored for any pain or improper techniques throughout the session.     Rehab Potential  Good    Clinical Impairments Affecting Rehab Potential  none    PT Frequency  2x / week    PT Duration  8 weeks    PT Treatment/Interventions  ADLs/Self Care Home Management;Cryotherapy;Electrical Stimulation;Moist Heat;Therapeutic exercise;Therapeutic activities;Functional mobility training;Stair training;Gait training;Neuromuscular re-education;Patient/family education;Passive range of motion;Taping;Vasopneumatic Device    Consulted and Agree with Plan of Care  Patient       Patient will benefit from skilled therapeutic intervention in order to improve the following deficits and impairments:  Abnormal gait, Pain, Decreased activity tolerance, Decreased range of motion, Decreased strength, Increased edema, Difficulty walking, Impaired flexibility  Visit Diagnosis: Acute pain of right knee  Muscle weakness (generalized)  Stiffness of right knee, not elsewhere classified  Localized edema  Other abnormalities of gait and mobility     Problem List Patient Active Problem List   Diagnosis Date Noted  . Breast cancer ([place]) 11/05/2015    Darling Cieslewicz, PTA 05/17/2017, 9:20 AM  [facility] [address] [place], [place], [address] Phone: [phone]   Fax:  [phone]  Name: Laurie French MRN: [id number] Date of Birth: 14-Oct-1965

## 2017-05-19 DIAGNOSIS — Z6841 Body Mass Index (BMI) 40.0 and over, adult: Secondary | ICD-10-CM | POA: Diagnosis not present

## 2017-05-19 DIAGNOSIS — C50911 Malignant neoplasm of unspecified site of right female breast: Secondary | ICD-10-CM | POA: Diagnosis not present

## 2017-05-19 DIAGNOSIS — C50919 Malignant neoplasm of unspecified site of unspecified female breast: Secondary | ICD-10-CM | POA: Diagnosis not present

## 2017-05-19 DIAGNOSIS — C50811 Malignant neoplasm of overlapping sites of right female breast: Secondary | ICD-10-CM | POA: Diagnosis not present

## 2017-05-19 DIAGNOSIS — Z170 Estrogen receptor positive status [ER+]: Secondary | ICD-10-CM | POA: Diagnosis not present

## 2017-05-19 DIAGNOSIS — N6321 Unspecified lump in the left breast, upper outer quadrant: Secondary | ICD-10-CM | POA: Diagnosis not present

## 2017-06-17 ENCOUNTER — Ambulatory Visit: Payer: BLUE CROSS/BLUE SHIELD

## 2017-06-17 DIAGNOSIS — C50811 Malignant neoplasm of overlapping sites of right female breast: Secondary | ICD-10-CM | POA: Diagnosis not present

## 2017-06-17 DIAGNOSIS — Z170 Estrogen receptor positive status [ER+]: Secondary | ICD-10-CM | POA: Diagnosis not present

## 2017-06-17 DIAGNOSIS — R921 Mammographic calcification found on diagnostic imaging of breast: Secondary | ICD-10-CM | POA: Diagnosis not present

## 2017-06-17 DIAGNOSIS — N641 Fat necrosis of breast: Secondary | ICD-10-CM | POA: Diagnosis not present

## 2017-06-17 DIAGNOSIS — Z9889 Other specified postprocedural states: Secondary | ICD-10-CM | POA: Diagnosis not present

## 2017-06-21 ENCOUNTER — Encounter: Payer: Self-pay | Admitting: Physical Therapy

## 2017-06-21 ENCOUNTER — Ambulatory Visit: Payer: BLUE CROSS/BLUE SHIELD | Attending: Family | Admitting: Physical Therapy

## 2017-06-21 DIAGNOSIS — R600 Localized edema: Secondary | ICD-10-CM | POA: Diagnosis not present

## 2017-06-21 DIAGNOSIS — M6281 Muscle weakness (generalized): Secondary | ICD-10-CM | POA: Diagnosis not present

## 2017-06-21 DIAGNOSIS — M25561 Pain in right knee: Secondary | ICD-10-CM | POA: Insufficient documentation

## 2017-06-21 DIAGNOSIS — M25661 Stiffness of right knee, not elsewhere classified: Secondary | ICD-10-CM | POA: Diagnosis not present

## 2017-06-21 DIAGNOSIS — R2689 Other abnormalities of gait and mobility: Secondary | ICD-10-CM

## 2017-06-21 NOTE — Therapy (Signed)
[facility] [address], [place], [place], [address] Phone: [phone]   Fax:  [phone]  Physical Therapy Treatment  Patient Details  Name: Laurie French MRN: [id number] Date of Birth: 1965-04-30 Referring Provider: Maryan Puls, MD   Encounter Date: 06/21/2017  PT End of Session - 06/21/17 1015    Visit Number  4    Date for PT Re-Evaluation  07/06/17    Authorization Type  BCBS- 30 visit limit    Authorization - Visit Number  4    Authorization - Number of Visits  30    PT Start Time  3614    PT Stop Time  1110    PT Time Calculation (min)  55 min    Activity Tolerance  Patient tolerated treatment well    Behavior During Therapy  [facility] for tasks assessed/performed       Past Medical History:  Diagnosis Date  . Cancer ([place])   . Sickle cell anemia (HCC)     Past Surgical History:  Procedure Laterality Date  . ABDOMINAL HYSTERECTOMY    . BREAST SURGERY      There were no vitals filed for this visit.  Subjective Assessment - 06/21/17 1019    Subjective  I do not ever sit down. Overall my knee is feeling better. Last visit 05/17/17. Pt reports she has been doing her HEP. Mild edema remains. Pt reports she is now cancer free.     Pertinent History  Rt knee arthroscopy: 04/27/17 complex tear of medial meniscus.  Breast cancer- 2018 with lymph node removal.    Currently in Pain?  Yes    Pain Score  3     Pain Location  Knee    Pain Orientation  Right    Pain Descriptors / Indicators  Dull;Sore    Aggravating Factors   Overoding it    Pain Relieving Factors  Her mind/ice    Multiple Pain Sites  No         [facility] PT Assessment - 06/21/17 0001      AROM   Right Knee Flexion  125            No data recorded       [facility] Adult PT Treatment/Exercise - 06/21/17 0001      Knee/Hip Exercises: Stretches   Gastroc Stretch  -- On slant board 3x 10 sec VC to breathe    Other  Knee/Hip Stretches  Gastroc MFR with foam roll in supine      Knee/Hip Exercises: Aerobic   Nustep  L2 x 8 min Pillow for lumbar      Knee/Hip Exercises: Standing   Lateral Step Up  Right;1 set;10 reps;Hand Hold: 2;Step Height: 6" pinching in knee reported    Forward Step Up  Right;15 reps;Hand Hold: 1;Step Height: 6"    Rebounder  weight shifting 1 min 3 ways Monitor for pain, pt less guarded moved quicker      Knee/Hip Exercises: Seated   Long Arc Quad  Strengthening;Right;2 sets;10 reps;Weights    Long Arc Quad Weight  2 lbs. With ball squeeze concurrent, VC to hold 3 sec      Knee/Hip Exercises: Supine   Short Arc Quad Sets  Strengthening;Right;2 sets;10 reps 2#      Vasopneumatic   Number Minutes Vasopneumatic   15 minutes    Vasopnuematic Location   Knee    Vasopneumatic Pressure  Medium    Vasopneumatic Temperature  3 flakes               PT Short Term Goals - 06/21/17 1021      PT SHORT TERM GOAL #2   Title  improve Rt LE strength to ascend steps into her house with step-over-step gait    Time  4    Period  Weeks    Status  Partially Met Can do often, but not 100% of time, especially if fatigues      PT SHORT TERM GOAL #3   Title  demonstrate symmetry with ambulation on level surface    Time  4    Status  On-going Improving: slight limp but pt reports not due to pain        PT Long Term Goals - 06/21/17 1051      PT LONG TERM GOAL #2   Title  demonstrate Rt knee A/ROM flexion to > or = to 110 degrees to improve squatting and descending steps    Time  8    Period  Weeks    Status  Achieved            Plan - 06/21/17 1017    Clinical Impression Statement  Pt reports the knee is heading in the right direction. Pain is not very limiting, but she does report her knee at times feels like it wants to give out. She can do stairs reciprocally 75% of the time. When she is fatigued she has to do them one at a time. Her knee flexion has greatly improved  since eval, meeting goal.  Mild edema persists. Pt would like today to be the last day of the [employer].      Rehab Potential  Good    Clinical Impairments Affecting Rehab Potential  none    PT Frequency  2x / week    PT Duration  8 weeks    PT Treatment/Interventions  ADLs/Self Care Home Management;Cryotherapy;Electrical Stimulation;Moist Heat;Therapeutic exercise;Therapeutic activities;Functional mobility training;Stair training;Gait training;Neuromuscular re-education;Patient/family education;Passive range of motion;Taping;Vasopneumatic Device    PT Next Visit Plan  Quad strength, weight bearing exercises    Consulted and Agree with Plan of Care  Patient       Patient will benefit from skilled therapeutic intervention in order to improve the following deficits and impairments:  Abnormal gait, Pain, Decreased activity tolerance, Decreased range of motion, Decreased strength, Increased edema, Difficulty walking, Impaired flexibility  Visit Diagnosis: Acute pain of right knee  Muscle weakness (generalized)  Stiffness of right knee, not elsewhere classified  Localized edema  Other abnormalities of gait and mobility     Problem List Patient Active Problem List   Diagnosis Date Noted  . Breast cancer ([place]) 11/05/2015    Kayline Sheer, PTA 06/21/2017, 10:58 AM  [facility] [address], [place], [place], [address] Phone: [phone]   Fax:  [phone]  Name: TORINA EY MRN: [id number] Date of Birth: Apr 13, 1965

## 2017-06-24 ENCOUNTER — Ambulatory Visit: Payer: BLUE CROSS/BLUE SHIELD

## 2017-06-24 DIAGNOSIS — M25561 Pain in right knee: Secondary | ICD-10-CM | POA: Diagnosis not present

## 2017-06-24 DIAGNOSIS — M25661 Stiffness of right knee, not elsewhere classified: Secondary | ICD-10-CM

## 2017-06-24 DIAGNOSIS — R2689 Other abnormalities of gait and mobility: Secondary | ICD-10-CM

## 2017-06-24 DIAGNOSIS — R600 Localized edema: Secondary | ICD-10-CM | POA: Diagnosis not present

## 2017-06-24 DIAGNOSIS — M6281 Muscle weakness (generalized): Secondary | ICD-10-CM

## 2017-06-24 NOTE — Therapy (Signed)
[facility] [address] [place], [place], [address] Phone: [phone]   Fax:  [phone]  Physical Therapy Treatment  Patient Details  Name: Laurie French MRN: [id number] Date of Birth: December 03, 1965 Referring Provider: Maryan Puls, MD   Encounter Date: 06/24/2017  PT End of Session - 06/24/17 1043    Visit Number  5    Date for PT Re-Evaluation  07/06/17    Authorization Type  BCBS- 30 visit limit    Authorization - Visit Number  5    Authorization - Number of Visits  30    PT Start Time  1005    PT Stop Time  1105    PT Time Calculation (min)  60 min    Activity Tolerance  Patient tolerated treatment well    Behavior During Therapy  [facility] for tasks assessed/performed       Past Medical History:  Diagnosis Date  . Cancer ([place])   . Sickle cell anemia (HCC)     Past Surgical History:  Procedure Laterality Date  . ABDOMINAL HYSTERECTOMY    . BREAST SURGERY      There were no vitals filed for this visit.  Subjective Assessment - 06/24/17 1017    Subjective  I don't sit down, I am always moving.  Sometimes my Rt knee gives out.      Patient Stated Goals  symmetry with gait, reduce stiffness, improve strength and endurance    Currently in Pain?  Yes    Pain Score  4     Pain Location  Knee    Pain Orientation  Right                       [facility] Adult PT Treatment/Exercise - 06/24/17 0001      Knee/Hip Exercises: Stretches   Gastroc Stretch  -- On slant board 10x10 seconds      Knee/Hip Exercises: Aerobic   Nustep  L2 x 10 min Pillow for lumbar      Knee/Hip Exercises: Standing   Forward Step Up  Right;Hand Hold: 1;Step Height: 6";2 sets;10 reps    Step Down  Right;2 sets;10 reps verbal cues for control    Rebounder  weight shifting 1 min 3 ways Monitor for pain, pt less guarded moved quicker      Knee/Hip Exercises: Seated   Long Arc Quad  Strengthening;Right;2  sets;10 reps;Weights    Long Arc Quad Weight  2 lbs. With ball squeeze concurrent, VC to hold 3 sec      Knee/Hip Exercises: Supine   Short Arc Quad Sets  Strengthening;Right;2 sets;10 reps 2#    Straight Leg Raises  Strengthening;Right;2 sets;10 reps      Vasopneumatic   Number Minutes Vasopneumatic   15 minutes    Vasopnuematic Location   Knee    Vasopneumatic Pressure  Medium    Vasopneumatic Temperature   3 flakes               PT Short Term Goals - 06/24/17 1019      PT SHORT TERM GOAL #1   Title  be independent in initial HEP    Status  Achieved        PT Long Term Goals - 06/21/17 1051      PT LONG TERM GOAL #2   Title  demonstrate Rt knee A/ROM flexion to > or = to 110 degrees to improve squatting and descending steps  Time  8    Period  Weeks    Status  Achieved            Plan - 06/24/17 1023    Clinical Impression Statement  Pt is able to perform all exercises in the clinic without limitation today.  Pt reports periods of Rt knee instability and PT focused on stability and eccentric control of the Rt knee today.  Pt with mild edema still present on the Rt.  Pt will continue to benefit from skilled PT for Rt LE strength, flexibility, stability and edema management.       Rehab Potential  Good    PT Frequency  2x / week    PT Duration  8 weeks    PT Treatment/Interventions  ADLs/Self Care Home Management;Cryotherapy;Electrical Stimulation;Moist Heat;Therapeutic exercise;Therapeutic activities;Functional mobility training;Stair training;Gait training;Neuromuscular re-education;Patient/family education;Passive range of motion;Taping;Vasopneumatic Device    PT Next Visit Plan  Quad strength, weight bearing exercises, proprioception    Recommended Other Services  initial certification is signed    Consulted and Agree with Plan of Care  Patient       Patient will benefit from skilled therapeutic intervention in order to improve the following deficits  and impairments:  Abnormal gait, Pain, Decreased activity tolerance, Decreased range of motion, Decreased strength, Increased edema, Difficulty walking, Impaired flexibility  Visit Diagnosis: Acute pain of right knee  Muscle weakness (generalized)  Stiffness of right knee, not elsewhere classified  Localized edema  Other abnormalities of gait and mobility     Problem List Patient Active Problem List   Diagnosis Date Noted  . Breast cancer ([place]) 11/05/2015    Laurie French 06/24/2017, 10:46 AM  [facility] [address] [place], [place], [address] Phone: [phone]   Fax:  [phone]  Name: Laurie French MRN: [id number] Date of Birth: 1965/12/02

## 2017-06-28 ENCOUNTER — Encounter: Payer: Self-pay | Admitting: Physical Therapy

## 2017-06-28 ENCOUNTER — Ambulatory Visit: Payer: BLUE CROSS/BLUE SHIELD | Admitting: Physical Therapy

## 2017-06-28 DIAGNOSIS — R2689 Other abnormalities of gait and mobility: Secondary | ICD-10-CM | POA: Diagnosis not present

## 2017-06-28 DIAGNOSIS — M25561 Pain in right knee: Secondary | ICD-10-CM | POA: Diagnosis not present

## 2017-06-28 DIAGNOSIS — R600 Localized edema: Secondary | ICD-10-CM | POA: Diagnosis not present

## 2017-06-28 DIAGNOSIS — M6281 Muscle weakness (generalized): Secondary | ICD-10-CM | POA: Diagnosis not present

## 2017-06-28 DIAGNOSIS — M25661 Stiffness of right knee, not elsewhere classified: Secondary | ICD-10-CM | POA: Diagnosis not present

## 2017-06-28 NOTE — Therapy (Signed)
[facility] [address] [place], [place], [address] Phone: [phone]   Fax:  [phone]  Physical Therapy Treatment  Patient Details  Name: Laurie French MRN: [id number] Date of Birth: [birth year] Referring Provider: Maryan Puls, MD   Encounter Date: 06/28/2017  PT End of Session - 06/28/17 1017    Visit Number  6    Date for PT Re-Evaluation  07/06/17    Authorization Type  BCBS- 30 visit limit    Authorization - Visit Number  6    Authorization - Number of Visits  30    PT Start Time  3785    PT Stop Time  1110    PT Time Calculation (min)  55 min    Activity Tolerance  Patient tolerated treatment well    Behavior During Therapy  [facility] for tasks assessed/performed       Past Medical History:  Diagnosis Date  . Cancer ([place])   . Sickle cell anemia (HCC)     Past Surgical History:  Procedure Laterality Date  . ABDOMINAL HYSTERECTOMY    . BREAST SURGERY      There were no vitals filed for this visit.  Subjective Assessment - 06/28/17 1019    Subjective  My knee actually feels swollen today. It feels good though. I worked all weekend.    Pertinent History  Rt knee arthroscopy: 04/27/17 complex tear of medial meniscus.  Breast cancer- 2018 with lymph node removal.    Limitations  Walking;Standing    Patient Stated Goals  symmetry with gait, reduce stiffness, improve strength and endurance    Currently in Pain?  Yes    Pain Score  3     Pain Location  Knee    Pain Orientation  Right    Pain Descriptors / Indicators  Dull;Sore    Aggravating Factors   Overdoing it    Pain Relieving Factors  her mind, ice    Multiple Pain Sites  No                       [facility] Adult PT Treatment/Exercise - 06/28/17 0001      Knee/Hip Exercises: Stretches   Other Knee/Hip Stretches  Gastroc MFR with foam roll in supine      Knee/Hip Exercises: Aerobic   Nustep  L2 x 10 min PTA present  for status update      Knee/Hip Exercises: Machines for Strengthening   Cybex Leg Press  Seat 7: Bil 50# 2x10       Knee/Hip Exercises: Standing   Lateral Step Up  Right;1 set;10 reps;Hand Hold: 1;Step Height: 6"    Forward Step Up  Right;2 sets;20 reps;Hand Hold: 2;Step Height: 6" Light UE only    Step Down  Right;2 sets;10 reps;Hand Hold: 2;Step Height: 6" Monitored for pain/alignment      Knee/Hip Exercises: Seated   Long Arc Quad  Strengthening;Right;3 sets;10 reps;Weights    Long Arc Quad Weight  3 lbs.      Knee/Hip Exercises: Supine   Short Arc Quad Sets  Strengthening;Right;2 sets;10 reps 3# added      Vasopneumatic   Number Minutes Vasopneumatic   15 minutes    Vasopnuematic Location   Knee    Vasopneumatic Pressure  Medium    Vasopneumatic Temperature   3 flakes               PT Short Term Goals - 06/24/17 1019  PT SHORT TERM GOAL #1   Title  be independent in initial HEP    Status  Achieved        PT Long Term Goals - 06/21/17 1051      PT LONG TERM GOAL #2   Title  demonstrate Rt knee A/ROM flexion to > or = to 110 degrees to improve squatting and descending steps    Time  8    Period  Weeks    Status  Achieved            Plan - 06/28/17 1018    Clinical Impression Statement  Pt continues to work on her RT knee strength. Pt was able to add new exercises that were more challenging to the quads in addition to adding weights/ Pt was monitored for alignment thoughout as she tends to adduct RT femur. Pt was able to demonstrate improved ability to perform step ups and without pain today.     Rehab Potential  Good    Clinical Impairments Affecting Rehab Potential  none    PT Frequency  2x / week    PT Duration  8 weeks    PT Treatment/Interventions  ADLs/Self Care Home Management;Cryotherapy;Electrical Stimulation;Moist Heat;Therapeutic exercise;Therapeutic activities;Functional mobility training;Stair training;Gait training;Neuromuscular  re-education;Patient/family education;Passive range of motion;Taping;Vasopneumatic Device    PT Next Visit Plan  Quad strength, weight bearing exercises, proprioception    Consulted and Agree with Plan of Care  Patient       Patient will benefit from skilled therapeutic intervention in order to improve the following deficits and impairments:  Abnormal gait, Pain, Decreased activity tolerance, Decreased range of motion, Decreased strength, Increased edema, Difficulty walking, Impaired flexibility  Visit Diagnosis: Acute pain of right knee  Muscle weakness (generalized)  Stiffness of right knee, not elsewhere classified  Localized edema  Other abnormalities of gait and mobility     Problem List Patient Active Problem List   Diagnosis Date Noted  . Breast cancer ([place]) 11/05/2015    [name], PTA 06/28/2017, 10:54 AM  [facility] [address], [place], [place], [address] Phone: [phone]   Fax:  [phone]  Name: Laurie French MRN: [id number] Date of Birth: Apr 02, 1965

## 2017-07-01 ENCOUNTER — Ambulatory Visit: Payer: BLUE CROSS/BLUE SHIELD

## 2017-07-01 DIAGNOSIS — R600 Localized edema: Secondary | ICD-10-CM

## 2017-07-01 DIAGNOSIS — M25561 Pain in right knee: Secondary | ICD-10-CM | POA: Diagnosis not present

## 2017-07-01 DIAGNOSIS — M25661 Stiffness of right knee, not elsewhere classified: Secondary | ICD-10-CM | POA: Diagnosis not present

## 2017-07-01 DIAGNOSIS — M6281 Muscle weakness (generalized): Secondary | ICD-10-CM

## 2017-07-01 DIAGNOSIS — R2689 Other abnormalities of gait and mobility: Secondary | ICD-10-CM

## 2017-07-01 NOTE — Therapy (Signed)
[facility] [address] [place], [place], [address] Phone: [phone]   Fax:  [phone]  Physical Therapy Treatment  Patient Details  Name: Laurie French MRN: [id number] Date of Birth: 1965-07-06 Referring Provider: Maryan Puls, MD   Encounter Date: 07/01/2017  PT End of Session - 07/01/17 1052    Visit Number  7    Date for PT Re-Evaluation  07/06/17    Authorization Type  BCBS- 30 visit limit    Authorization - Visit Number  7    Authorization - Number of Visits  30    PT Start Time  1011    PT Stop Time  1107    PT Time Calculation (min)  56 min    Activity Tolerance  Patient tolerated treatment well    Behavior During Therapy  [facility] for tasks assessed/performed       Past Medical History:  Diagnosis Date  . Cancer ([place])   . Sickle cell anemia (HCC)     Past Surgical History:  Procedure Laterality Date  . ABDOMINAL HYSTERECTOMY    . BREAST SURGERY      There were no vitals filed for this visit.  Subjective Assessment - 07/01/17 1013    Subjective  I have a busy day ahead.      Pertinent History  Rt knee arthroscopy: 04/27/17 complex tear of medial meniscus.  Breast cancer- 2018 with lymph node removal.    Patient Stated Goals  symmetry with gait, reduce stiffness, improve strength and endurance    Currently in Pain?  Yes    Pain Score  0-No pain    Pain Location  Knee    Pain Orientation  Right    Pain Descriptors / Indicators  Dull;Sore    Pain Type  Surgical pain    Pain Onset  More than a month ago    Pain Frequency  Constant    Aggravating Factors   overdoing it    Pain Relieving Factors  ice, rest                       [facility] Adult PT Treatment/Exercise - 07/01/17 0001      Knee/Hip Exercises: Aerobic   Nustep  L2 x 10 min PTA present for status update      Knee/Hip Exercises: Machines for Strengthening   Cybex Leg Press  Bil legs 60# 2x10, 40# Rt  only 2x10 seat 7      Knee/Hip Exercises: Standing   Forward Step Up  Right;2 sets;20 reps;Limitations    Forward Step Up Limitations  Bosu ball    Rocker Board  2 minutes    Walking with Sports Cord  20# 4 ways: x10 each      Knee/Hip Exercises: Seated   Long Arc Quad  Strengthening;Right;3 sets;10 reps;Weights    Long Arc Quad Weight  3 lbs.      Vasopneumatic   Number Minutes Vasopneumatic   15 minutes    Vasopnuematic Location   Knee    Vasopneumatic Pressure  Medium    Vasopneumatic Temperature   3 flakes               PT Short Term Goals - 06/24/17 1019      PT SHORT TERM GOAL #1   Title  be independent in initial HEP    Status  Achieved        PT Long Term Goals - 06/21/17  [place] #2   Title  demonstrate Rt knee A/ROM flexion to > or = to 110 degrees to improve squatting and descending steps    Time  8    Period  Weeks    Status  Achieved            Plan - 07/01/17 1022    Clinical Impression Statement  Pt is making steady progress s/p Rt knee arthroscopy.  Pt denies any pain today.  Pt is performing strength and flexibility exercises and requires verbal cues for quad activation and stabilization at the hip.  Pt will benefit from skilled PT for Rt LE strength, gait, endurance and edema management.      Rehab Potential  Good    PT Frequency  2x / week    PT Duration  8 weeks    PT Treatment/Interventions  ADLs/Self Care Home Management;Cryotherapy;Electrical Stimulation;Moist Heat;Therapeutic exercise;Therapeutic activities;Functional mobility training;Stair training;Gait training;Neuromuscular re-education;Patient/family education;Passive range of motion;Taping;Vasopneumatic Device    PT Next Visit Plan  Quad strength, weight bearing exercises, proprioception    Consulted and Agree with Plan of Care  Patient       Patient will benefit from skilled therapeutic intervention in order to improve the following deficits and  impairments:  Abnormal gait, Pain, Decreased activity tolerance, Decreased range of motion, Decreased strength, Increased edema, Difficulty walking, Impaired flexibility  Visit Diagnosis: Acute pain of right knee  Muscle weakness (generalized)  Stiffness of right knee, not elsewhere classified  Localized edema  Other abnormalities of gait and mobility     Problem List Patient Active Problem List   Diagnosis Date Noted  . Breast cancer ([place]) 11/05/2015     Sigurd Sos, PT 07/01/17 10:55 AM  [facility] [address], [place], [place], [address] Phone: [phone]   Fax:  [phone]  Name: Laurie French MRN: [id number] Date of Birth: Jul 10, 1965

## 2017-07-05 ENCOUNTER — Encounter: Payer: Self-pay | Admitting: Physical Therapy

## 2017-07-05 ENCOUNTER — Ambulatory Visit: Payer: BLUE CROSS/BLUE SHIELD | Admitting: Physical Therapy

## 2017-07-05 DIAGNOSIS — M6281 Muscle weakness (generalized): Secondary | ICD-10-CM | POA: Diagnosis not present

## 2017-07-05 DIAGNOSIS — M25561 Pain in right knee: Secondary | ICD-10-CM

## 2017-07-05 DIAGNOSIS — R600 Localized edema: Secondary | ICD-10-CM

## 2017-07-05 DIAGNOSIS — M25661 Stiffness of right knee, not elsewhere classified: Secondary | ICD-10-CM

## 2017-07-05 DIAGNOSIS — R2689 Other abnormalities of gait and mobility: Secondary | ICD-10-CM

## 2017-07-05 NOTE — Therapy (Signed)
[facility] [address], [place], [place], [address] Phone: [phone]   Fax:  [phone]  Physical Therapy Treatment  Patient Details  Name: Laurie French MRN: [id number] Date of Birth: 01/24/1966 Referring Provider: Maryan Puls, MD   Encounter Date: 07/05/2017  PT End of Session - 07/05/17 1020    Visit Number  8    Date for PT Re-Evaluation  07/06/17    Authorization Type  BCBS- 30 visit limit    Authorization - Visit Number  8    Authorization - Number of Visits  30    PT Start Time  1016    PT Stop Time  1110    PT Time Calculation (min)  54 min    Activity Tolerance  Patient tolerated treatment well    Behavior During Therapy  [facility] for tasks assessed/performed       Past Medical History:  Diagnosis Date  . Cancer ([place])   . Sickle cell anemia (HCC)     Past Surgical History:  Procedure Laterality Date  . ABDOMINAL HYSTERECTOMY    . BREAST SURGERY      There were no vitals filed for this visit.                    [facility] Adult PT Treatment/Exercise - 07/05/17 0001      Knee/Hip Exercises: Aerobic   Nustep  L2 x 10 min PTA present for status update      Knee/Hip Exercises: Machines for Strengthening   Cybex Leg Press  Bil legs 60# 2x10, 40# Rt only 2x10 seat 7, weight still appropriate      Knee/Hip Exercises: Standing   Forward Step Up  Right;2 sets;20 reps;Limitations    Forward Step Up Limitations  Bosu ball    Walking with Sports Cord  25# 4 ways: x10 each      Knee/Hip Exercises: Seated   Long Arc Quad  Strengthening;Right;2 sets;10 reps;Weights    Long Arc Quad Weight  4 lbs.      Vasopneumatic   Number Minutes Vasopneumatic   15 minutes    Vasopnuematic Location   Knee    Vasopneumatic Pressure  Medium    Vasopneumatic Temperature   3 flakes               PT Short Term Goals - 07/05/17 1032      PT SHORT TERM GOAL #3   Title   demonstrate symmetry with ambulation on level surface    Time  4    Status  Achieved        PT Long Term Goals - 06/21/17 1051      PT LONG TERM GOAL #2   Title  demonstrate Rt knee A/ROM flexion to > or = to 110 degrees to improve squatting and descending steps    Time  8    Period  Weeks    Status  Achieved            Plan - 07/05/17 1021    Clinical Impression Statement  Pt moving faster, less guarded and more confident. Pt reporyts she feels 75% where she wants to be. She was able to tolerate increased weights today for her strengthening without any pain. She reports she can go stairs ok, but she avoids them.    Rehab Potential  Good    Clinical Impairments Affecting Rehab Potential  none    PT Frequency  2x /  week    PT Duration  8 weeks    PT Treatment/Interventions  ADLs/Self Care Home Management;Cryotherapy;Electrical Stimulation;Moist Heat;Therapeutic exercise;Therapeutic activities;Functional mobility training;Stair training;Gait training;Neuromuscular re-education;Patient/family education;Passive range of motion;Taping;Vasopneumatic Device    PT Next Visit Plan  Quad strength, weight bearing exercises, proprioception    Consulted and Agree with Plan of Care  Patient       Patient will benefit from skilled therapeutic intervention in order to improve the following deficits and impairments:  Abnormal gait, Pain, Decreased activity tolerance, Decreased range of motion, Decreased strength, Increased edema, Difficulty walking, Impaired flexibility  Visit Diagnosis: Acute pain of right knee  Muscle weakness (generalized)  Stiffness of right knee, not elsewhere classified  Localized edema  Other abnormalities of gait and mobility     Problem List Patient Active Problem List   Diagnosis Date Noted  . Breast cancer ([place]) 11/05/2015    Omkar Stratmann, PTA 07/05/2017, 10:55 AM  [facility] [address] [place], [place], [address] Phone: [phone]   Fax:  [phone]  Name: Laurie French MRN: [id number] Date of Birth: 03-Apr-1965

## 2017-07-08 ENCOUNTER — Ambulatory Visit: Payer: BLUE CROSS/BLUE SHIELD

## 2017-07-08 DIAGNOSIS — R2689 Other abnormalities of gait and mobility: Secondary | ICD-10-CM

## 2017-07-08 DIAGNOSIS — M25661 Stiffness of right knee, not elsewhere classified: Secondary | ICD-10-CM | POA: Diagnosis not present

## 2017-07-08 DIAGNOSIS — M25561 Pain in right knee: Secondary | ICD-10-CM | POA: Diagnosis not present

## 2017-07-08 DIAGNOSIS — M6281 Muscle weakness (generalized): Secondary | ICD-10-CM

## 2017-07-08 DIAGNOSIS — R600 Localized edema: Secondary | ICD-10-CM

## 2017-07-08 NOTE — Therapy (Signed)
[facility] [address] [place], [place], [address] Phone: [phone]   Fax:  [phone]  Physical Therapy Treatment  Patient Details  Name: Laurie French MRN: [id number] Date of Birth: 1966/03/04 Referring Provider: Maryan Puls, MD   Encounter Date: 07/08/2017  PT End of Session - 07/08/17 1051    Visit Number  9    Date for PT Re-Evaluation  08/05/17    Authorization Type  BCBS- 30 visit limit    Authorization - Visit Number  9    Authorization - Number of Visits  30    PT Start Time  1020    PT Stop Time  1103    PT Time Calculation (min)  43 min    Activity Tolerance  Patient tolerated treatment well    Behavior During Therapy  [facility] for tasks assessed/performed       Past Medical History:  Diagnosis Date  . Cancer ([place])   . Sickle cell anemia (HCC)     Past Surgical History:  Procedure Laterality Date  . ABDOMINAL HYSTERECTOMY    . BREAST SURGERY      There were no vitals filed for this visit.  Subjective Assessment - 07/08/17 1024    Subjective  I am feeling good today.  I had 5/10 pain yesterday because I was pushing it.      Pertinent History  Rt knee arthroscopy: 04/27/17 complex tear of medial meniscus.  Breast cancer- 2018 with lymph node removal.    Currently in Pain?  No/denies         [facility] PT Assessment - 07/08/17 0001      Assessment   Medical Diagnosis  other meniscus derangements, posterior horn of medial meniscus, Rt knee.  s/p Rt knee arthroscopy      AROM   Right Knee Flexion  125                   [facility] Adult PT Treatment/Exercise - 07/08/17 0001      Knee/Hip Exercises: Aerobic   Nustep  L2 x 10 min PT present for status update      Knee/Hip Exercises: Machines for Strengthening   Cybex Leg Press  Bil legs 60# 2x10, 40# Rt only 2x10 seat 7, weight still appropriate      Knee/Hip Exercises: Standing   Forward Step Up  Right;2 sets;20  reps;Limitations    Forward Step Up Limitations  Bosu ball    SLS  on blue pod 3x20 seconds    Walking with Sports Cord  25# 4 ways: x10 each      Knee/Hip Exercises: Seated   Long Arc Quad  Strengthening;Right;2 sets;10 reps;Weights    Long Arc Quad Weight  4 lbs.      Vasopneumatic   Number Minutes Vasopneumatic   15 minutes    Vasopnuematic Location   Knee    Vasopneumatic Pressure  Medium    Vasopneumatic Temperature   3 flakes               PT Short Term Goals - 07/08/17 1026      PT SHORT TERM GOAL #2   Title  improve Rt LE strength to ascend steps into her house with step-over-step gait    Status  Achieved      PT SHORT TERM GOAL #3   Title  demonstrate symmetry with ambulation on level surface    Status  Achieved  PT Long Term Goals - 06/21/17 1051      PT LONG TERM GOAL #2   Title  demonstrate Rt knee A/ROM flexion to > or = to 110 degrees to improve squatting and descending steps    Time  8    Period  Weeks    Status  Achieved            Plan - 07/08/17 1028    Clinical Impression Statement  Pt is making steady progress s/p Rt knee scope.  Pt was able to increase weights this week.  Pt is able to ascend steps with step-over-step gait and reports fatigue at the end of the day with this. Pt demonstrates quad fatigue with long arc quads, single leg stance and bosu step ups today.   Pt demonstates symmetry with ambulation on level surface.  Pt with mild edema in the Rt knee with increased activity.  Pt will continue to benefit from skilled PT for Rt LE strength, proprioception and flexibility.    Rehab Potential  Good    PT Frequency  2x / week    PT Duration  8 weeks    PT Treatment/Interventions  ADLs/Self Care Home Management;Cryotherapy;Electrical Stimulation;Moist Heat;Therapeutic exercise;Therapeutic activities;Functional mobility training;Stair training;Gait training;Neuromuscular re-education;Patient/family education;Passive range of  motion;Taping;Vasopneumatic Device    PT Next Visit Plan  Quad strength, weight bearing exercises, proprioception    Recommended Other Services  initial certification signed.  Recert sent 5/99/35    Consulted and Agree with Plan of Care  Patient       Patient will benefit from skilled therapeutic intervention in order to improve the following deficits and impairments:  Abnormal gait, Pain, Decreased activity tolerance, Decreased range of motion, Decreased strength, Increased edema, Difficulty walking, Impaired flexibility  Visit Diagnosis: Acute pain of right knee - Plan: PT plan of care cert/re-cert  Muscle weakness (generalized) - Plan: PT plan of care cert/re-cert  Stiffness of right knee, not elsewhere classified - Plan: PT plan of care cert/re-cert  Localized edema - Plan: PT plan of care cert/re-cert  Other abnormalities of gait and mobility     Problem List Patient Active Problem List   Diagnosis Date Noted  . Breast cancer ([place]) 11/05/2015     Sigurd Sos, PT 07/08/17 11:01 AM  [facility] [address], [place], [place], [address] Phone: [phone]   Fax:  [phone]  Name: Laurie French MRN: [id number] Date of Birth: 11/05/1965

## 2017-07-12 ENCOUNTER — Telehealth: Payer: Self-pay | Admitting: Physical Therapy

## 2017-07-12 ENCOUNTER — Ambulatory Visit: Payer: BLUE CROSS/BLUE SHIELD | Admitting: Physical Therapy

## 2017-07-14 DIAGNOSIS — Z801 Family history of malignant neoplasm of trachea, bronchus and lung: Secondary | ICD-10-CM | POA: Diagnosis not present

## 2017-07-14 DIAGNOSIS — Z91040 Latex allergy status: Secondary | ICD-10-CM | POA: Diagnosis not present

## 2017-07-14 DIAGNOSIS — Z9221 Personal history of antineoplastic chemotherapy: Secondary | ICD-10-CM | POA: Diagnosis not present

## 2017-07-14 DIAGNOSIS — Z90710 Acquired absence of both cervix and uterus: Secondary | ICD-10-CM | POA: Diagnosis not present

## 2017-07-14 DIAGNOSIS — C50811 Malignant neoplasm of overlapping sites of right female breast: Secondary | ICD-10-CM | POA: Diagnosis not present

## 2017-07-14 DIAGNOSIS — Z79811 Long term (current) use of aromatase inhibitors: Secondary | ICD-10-CM | POA: Diagnosis not present

## 2017-07-14 DIAGNOSIS — Z9011 Acquired absence of right breast and nipple: Secondary | ICD-10-CM | POA: Diagnosis not present

## 2017-07-14 DIAGNOSIS — Z6841 Body Mass Index (BMI) 40.0 and over, adult: Secondary | ICD-10-CM | POA: Diagnosis not present

## 2017-07-14 DIAGNOSIS — D573 Sickle-cell trait: Secondary | ICD-10-CM | POA: Diagnosis not present

## 2017-07-14 DIAGNOSIS — Z170 Estrogen receptor positive status [ER+]: Secondary | ICD-10-CM | POA: Diagnosis not present

## 2017-07-14 DIAGNOSIS — Z87891 Personal history of nicotine dependence: Secondary | ICD-10-CM | POA: Diagnosis not present

## 2017-07-15 ENCOUNTER — Ambulatory Visit: Payer: BLUE CROSS/BLUE SHIELD

## 2017-07-15 DIAGNOSIS — R600 Localized edema: Secondary | ICD-10-CM | POA: Diagnosis not present

## 2017-07-15 DIAGNOSIS — M25561 Pain in right knee: Secondary | ICD-10-CM

## 2017-07-15 DIAGNOSIS — R2689 Other abnormalities of gait and mobility: Secondary | ICD-10-CM | POA: Diagnosis not present

## 2017-07-15 DIAGNOSIS — M25661 Stiffness of right knee, not elsewhere classified: Secondary | ICD-10-CM | POA: Diagnosis not present

## 2017-07-15 DIAGNOSIS — M6281 Muscle weakness (generalized): Secondary | ICD-10-CM | POA: Diagnosis not present

## 2017-07-15 NOTE — Therapy (Signed)
[facility] [address] [place], [place], [address] Phone: [phone]   Fax:  [phone]  Physical Therapy Treatment  Patient Details  Name: Laurie French MRN: [id number] Date of Birth: [birth year] Referring Provider: Maryan Puls, MD   Encounter Date: 07/15/2017 Progress Note Reporting Period 05/11/17 to 07/15/17  See note below for Objective Data and Assessment of Progress/Goals.      PT End of Session - 07/15/17 1051    Visit Number  10    Date for PT Re-Evaluation  08/05/17    Authorization Type  BCBS- 30 visit limit    Authorization - Visit Number  10    Authorization - Number of Visits  30    PT Start Time  1022 late    PT Stop Time  1110    PT Time Calculation (min)  48 min    Activity Tolerance  Patient tolerated treatment well    Behavior During Therapy  [facility] for tasks assessed/performed       Past Medical History:  Diagnosis Date  . Cancer ([place])   . Sickle cell anemia (HCC)     Past Surgical History:  Procedure Laterality Date  . ABDOMINAL HYSTERECTOMY    . BREAST SURGERY      There were no vitals filed for this visit.  Subjective Assessment - 07/15/17 1059    Subjective  I feel like my Rt knee is weak.  I am having less pain.    Currently in Pain?  No/denies                       [facility] Adult PT Treatment/Exercise - 07/15/17 0001      Knee/Hip Exercises: Aerobic   Nustep  L2 x 10 min PT present for status update      Knee/Hip Exercises: Machines for Strengthening   Cybex Leg Press  Bil legs 70# 2x10, 45# Rt only 2x10 seat 7, weight still appropriate      Knee/Hip Exercises: Standing   Forward Step Up  Right;2 sets;20 reps;Limitations    Forward Step Up Limitations  Bosu ball    Step Down  Right;2 sets;10 reps;Hand Hold: 2;Step Height: 6" Rt LE instability with this today    SLS  on blue pod 3x20 seconds    Walking with Sports Cord  25# 4 ways: x10  each      Knee/Hip Exercises: Seated   Long Arc Quad  Strengthening;Right;2 sets;10 reps;Weights    Long Arc Quad Weight  4 lbs.      Vasopneumatic   Number Minutes Vasopneumatic   15 minutes    Vasopnuematic Location   Knee    Vasopneumatic Pressure  Medium    Vasopneumatic Temperature   3 flakes               PT Short Term Goals - 07/08/17 1026      PT SHORT TERM GOAL #2   Title  improve Rt LE strength to ascend steps into her house with step-over-step gait    Status  Achieved      PT SHORT TERM GOAL #3   Title  demonstrate symmetry with ambulation on level surface    Status  Achieved        PT Long Term Goals - 07/15/17 1028      PT LONG TERM GOAL #1   Title  be independent in advanced HEP    Time  8  Period  Weeks    Status  On-going    Target Date  08/05/17      PT LONG TERM GOAL #2   Title  demonstrate Rt knee A/ROM flexion to > or = to 110 degrees to improve squatting and descending steps    Baseline  125    Status  Achieved      PT LONG TERM GOAL #3   Title  demonstrate 4+/5 Rt hip and knee strength to improve endurance and stability    Baseline  5/5    Status  Achieved    Target Date  08/05/17      PT LONG TERM GOAL #4   Title  improve Rt LE strength to ascend and descend steps with step-over-step gait pattern    Baseline  step to gait due to instability    Time  8    Period  Weeks    Status  On-going      PT LONG TERM GOAL #5   Title  reduce FOTO to < or = to 37% limitation    Baseline  47% limitation    Time  8    Period  Weeks    Status  On-going    Target Date  08/05/17            Plan - 07/15/17 1039    Clinical Impression Statement  Pt is making steady progress s/p knee scope.  Rt knee strength is 5/5 yet pt demonstrates functional instability with ascending and descending steps.  Pt continues to ascend and descend steps with step-to gait and use of rail.  Pt with FOTO improved to 47% limitation (53% limitation at  evaluation).  Pt will continue to benefit from skilled PT for Rt knee strength and stability progression to improve endurance for long periods of standing or with steps.    Rehab Potential  Good    PT Frequency  2x / week    PT Duration  8 weeks    PT Treatment/Interventions  ADLs/Self Care Home Management;Cryotherapy;Electrical Stimulation;Moist Heat;Therapeutic exercise;Therapeutic activities;Functional mobility training;Stair training;Gait training;Neuromuscular re-education;Patient/family education;Passive range of motion;Taping;Vasopneumatic Device    PT Next Visit Plan  Quad strength, weight bearing exercises, proprioception    Consulted and Agree with Plan of Care  Patient       Patient will benefit from skilled therapeutic intervention in order to improve the following deficits and impairments:  Abnormal gait, Pain, Decreased activity tolerance, Decreased range of motion, Decreased strength, Increased edema, Difficulty walking, Impaired flexibility  Visit Diagnosis: Acute pain of right knee  Muscle weakness (generalized)  Stiffness of right knee, not elsewhere classified  Localized edema  Other abnormalities of gait and mobility     Problem List Patient Active Problem List   Diagnosis Date Noted  . Breast cancer ([place]) 11/05/2015    Sigurd Sos, PT 07/15/17 11:01 AM  [facility] [address] [place] [address] [place], [place], [address] Phone: [phone]   Fax:  [phone]  Name: KALY MCQUARY MRN: [id number] Date of Birth: [birth year]

## 2017-07-19 ENCOUNTER — Ambulatory Visit: Payer: BLUE CROSS/BLUE SHIELD

## 2017-07-19 DIAGNOSIS — M25661 Stiffness of right knee, not elsewhere classified: Secondary | ICD-10-CM | POA: Diagnosis not present

## 2017-07-19 DIAGNOSIS — R2689 Other abnormalities of gait and mobility: Secondary | ICD-10-CM

## 2017-07-19 DIAGNOSIS — M6281 Muscle weakness (generalized): Secondary | ICD-10-CM | POA: Diagnosis not present

## 2017-07-19 DIAGNOSIS — M25561 Pain in right knee: Secondary | ICD-10-CM

## 2017-07-19 DIAGNOSIS — R600 Localized edema: Secondary | ICD-10-CM

## 2017-07-19 NOTE — Therapy (Signed)
[facility] [address] [place], [place], [address] Phone: [phone]   Fax:  [phone]  Physical Therapy Treatment  Patient Details  Name: Laurie French MRN: [id number] Date of Birth: April 04, 1965 Referring Provider: Maryan Puls, MD   Encounter Date: 07/19/2017  PT End of Session - 07/19/17 1050    Visit Number  11    Date for PT Re-Evaluation  08/05/17    Authorization Type  BCBS- 30 visit limit    Authorization - Visit Number  11    Authorization - Number of Visits  30    PT Start Time  1013    PT Stop Time  1113    PT Time Calculation (min)  60 min    Activity Tolerance  Patient tolerated treatment well    Behavior During Therapy  [facility] for tasks assessed/performed       Past Medical History:  Diagnosis Date  . Cancer ([place])   . Sickle cell anemia (HCC)     Past Surgical History:  Procedure Laterality Date  . ABDOMINAL HYSTERECTOMY    . BREAST SURGERY      There were no vitals filed for this visit.  Subjective Assessment - 07/19/17 1050    Subjective  I have been walking a lot.    Currently in Pain?  No/denies                       [facility] Adult PT Treatment/Exercise - 07/19/17 0001      Knee/Hip Exercises: Aerobic   Stationary Bike  Level 2x 10 minutes with verbal cues for quad activiation.      Knee/Hip Exercises: Machines for Strengthening   Cybex Leg Press  Bil legs 70# 2x10, 45# Rt only 2x10 seat 7, weight still appropriate      Knee/Hip Exercises: Standing   Forward Step Up  Right;2 sets;20 reps;Limitations    Forward Step Up Limitations  Bosu ball    Step Down  Right;2 sets;10 reps;Hand Hold: 2;Step Height: 4" Rt LE instability with this today    SLS  on blue pod 3x20 seconds    Walking with Sports Cord  25# 4 ways: x10 each      Knee/Hip Exercises: Seated   Long Arc Quad  Strengthening;Right;2 sets;10 reps;Weights    Long Arc Quad Weight  4 lbs.      Vasopneumatic   Number Minutes Vasopneumatic   15 minutes    Vasopnuematic Location   Knee    Vasopneumatic Pressure  Medium    Vasopneumatic Temperature   3 flakes               PT Short Term Goals - 07/08/17 1026      PT SHORT TERM GOAL #2   Title  improve Rt LE strength to ascend steps into her house with step-over-step gait    Status  Achieved      PT SHORT TERM GOAL #3   Title  demonstrate symmetry with ambulation on level surface    Status  Achieved        PT Long Term Goals - 07/15/17 1028      PT LONG TERM GOAL #1   Title  be independent in advanced HEP    Time  8    Period  Weeks    Status  On-going    Target Date  08/05/17      PT LONG TERM GOAL #2  Title  demonstrate Rt knee A/ROM flexion to > or = to 110 degrees to improve squatting and descending steps    Baseline  125    Status  Achieved      PT LONG TERM GOAL #3   Title  demonstrate 4+/5 Rt hip and knee strength to improve endurance and stability    Baseline  5/5    Status  Achieved    Target Date  08/05/17      PT LONG TERM GOAL #4   Title  improve Rt LE strength to ascend and descend steps with step-over-step gait pattern    Baseline  step to gait due to instability    Time  8    Period  Weeks    Status  On-going      PT LONG TERM GOAL #5   Title  reduce FOTO to < or = to 37% limitation    Baseline  47% limitation    Time  8    Period  Weeks    Status  On-going    Target Date  08/05/17            Plan - 07/19/17 1025    Clinical Impression Statement  Pt is making steady progress after knee scope surgery.  Pt demonstrates functional instability of the Rt hip and knee with ascending and descending steps.  Pt continues to use step-to gait.  Pt requires tactile and verbal cues for controlled eccentric motion with step-downs.  Pt will continue to benefit from skilled PT for Rt LE strength, stability and functional mobility.      Rehab Potential  Good    PT Frequency  2x / week     PT Duration  8 weeks    PT Treatment/Interventions  ADLs/Self Care Home Management;Cryotherapy;Electrical Stimulation;Moist Heat;Therapeutic exercise;Therapeutic activities;Functional mobility training;Stair training;Gait training;Neuromuscular re-education;Patient/family education;Passive range of motion;Taping;Vasopneumatic Device    PT Next Visit Plan  Quad strength, weight bearing exercises, proprioception    Consulted and Agree with Plan of Care  Patient       Patient will benefit from skilled therapeutic intervention in order to improve the following deficits and impairments:  Abnormal gait, Pain, Decreased activity tolerance, Decreased range of motion, Decreased strength, Increased edema, Difficulty walking, Impaired flexibility  Visit Diagnosis: Acute pain of right knee  Muscle weakness (generalized)  Stiffness of right knee, not elsewhere classified  Localized edema  Other abnormalities of gait and mobility     Problem List Patient Active Problem List   Diagnosis Date Noted  . Breast cancer ([place]) 11/05/2015     Sigurd Sos, PT 07/19/17 10:54 AM  [facility] [address], [place], [place], [address] Phone: [phone]   Fax:  [phone]  Name: Laurie French MRN: [id number] Date of Birth: 03-03-1966

## 2017-07-21 ENCOUNTER — Ambulatory Visit: Payer: BLUE CROSS/BLUE SHIELD | Attending: Family | Admitting: Physical Therapy

## 2017-07-21 ENCOUNTER — Encounter: Payer: Self-pay | Admitting: Physical Therapy

## 2017-07-21 DIAGNOSIS — M6281 Muscle weakness (generalized): Secondary | ICD-10-CM | POA: Diagnosis not present

## 2017-07-21 DIAGNOSIS — M25561 Pain in right knee: Secondary | ICD-10-CM | POA: Diagnosis not present

## 2017-07-21 DIAGNOSIS — R600 Localized edema: Secondary | ICD-10-CM

## 2017-07-21 DIAGNOSIS — M25661 Stiffness of right knee, not elsewhere classified: Secondary | ICD-10-CM

## 2017-07-21 DIAGNOSIS — R2689 Other abnormalities of gait and mobility: Secondary | ICD-10-CM

## 2017-07-21 NOTE — Therapy (Signed)
[facility] [address], [place], [place], [address] Phone: [phone]   Fax:  [phone]  Physical Therapy Treatment  Patient Details  Name: Laurie French MRN: [id number] Date of Birth: 1965-10-28 Referring Provider: Maryan Puls, MD   Encounter Date: 07/21/2017  PT End of Session - 07/21/17 1014    Visit Number  11    Date for PT Re-Evaluation  08/05/17    Authorization Type  BCBS- 30 visit limit    Authorization - Visit Number  12    Authorization - Number of Visits  30    PT Start Time  2119    PT Stop Time  1110    PT Time Calculation (min)  56 min    Activity Tolerance  Patient tolerated treatment well    Behavior During Therapy  [facility] for tasks assessed/performed       Past Medical History:  Diagnosis Date  . Cancer ([place])   . Sickle cell anemia (HCC)     Past Surgical History:  Procedure Laterality Date  . ABDOMINAL HYSTERECTOMY    . BREAST SURGERY      There were no vitals filed for this visit.  Subjective Assessment - 07/21/17 1015    Subjective  I am feeling great.    Pertinent History  Rt knee arthroscopy: 04/27/17 complex tear of medial meniscus.  Breast cancer- 2018 with lymph node removal.    Limitations  Walking;Standing    Currently in Pain?  No/denies    Multiple Pain Sites  No                       [facility] Adult PT Treatment/Exercise - 07/21/17 0001      Knee/Hip Exercises: Aerobic   Stationary Bike  Level 2x 10 minutes with verbal cues for quad activiation. PTA present for status    Elliptical  R2 L3 4 min      Knee/Hip Exercises: Machines for Strengthening   Cybex Leg Press  Seat 7: Bil 70 2x15, RTLE 45# 2x15      Knee/Hip Exercises: Standing   Forward Step Up  Right;2 sets;20 reps;Limitations    Forward Step Up Limitations  Bosu ball    SLS  on blue pod 3x30 seconds    Walking with Sports Cord  25# 4 ways: x10 each      Knee/Hip Exercises:  Seated   Long Arc Quad  Strengthening;Right;2 sets;15 reps;Weights    Long Arc Quad Weight  4 lbs.      Vasopneumatic   Number Minutes Vasopneumatic   15 minutes    Vasopnuematic Location   Knee    Vasopneumatic Pressure  Medium    Vasopneumatic Temperature   3 flakes               PT Short Term Goals - 07/08/17 1026      PT SHORT TERM GOAL #2   Title  improve Rt LE strength to ascend steps into her house with step-over-step gait    Status  Achieved      PT SHORT TERM GOAL #3   Title  demonstrate symmetry with ambulation on level surface    Status  Achieved        PT Long Term Goals - 07/15/17 1028      PT LONG TERM GOAL #1   Title  be independent in advanced HEP    Time  8    Period  Weeks    Status  On-going    Target Date  08/05/17      PT LONG TERM GOAL #2   Title  demonstrate Rt knee A/ROM flexion to > or = to 110 degrees to improve squatting and descending steps    Baseline  125    Status  Achieved      PT LONG TERM GOAL #3   Title  demonstrate 4+/5 Rt hip and knee strength to improve endurance and stability    Baseline  5/5    Status  Achieved    Target Date  08/05/17      PT LONG TERM GOAL #4   Title  improve Rt LE strength to ascend and descend steps with step-over-step gait pattern    Baseline  step to gait due to instability    Time  8    Period  Weeks    Status  On-going      PT LONG TERM GOAL #5   Title  reduce FOTO to < or = to 37% limitation    Baseline  47% limitation    Time  8    Period  Weeks    Status  On-going    Target Date  08/05/17            Plan - 07/21/17 1015    Clinical Impression Statement  Occasional and intermittent RT quad giving way due to quad weakness. Increased difficulty of strength exercises today by adding weight, reps or stance time.      Rehab Potential  Good    Clinical Impairments Affecting Rehab Potential  none    PT Frequency  2x / week    PT Duration  8 weeks    PT Treatment/Interventions   ADLs/Self Care Home Management;Cryotherapy;Electrical Stimulation;Moist Heat;Therapeutic exercise;Therapeutic activities;Functional mobility training;Stair training;Gait training;Neuromuscular re-education;Patient/family education;Passive range of motion;Taping;Vasopneumatic Device    PT Next Visit Plan  Quad strength, weight bearing exercises, proprioception    Consulted and Agree with Plan of Care  Patient       Patient will benefit from skilled therapeutic intervention in order to improve the following deficits and impairments:  Abnormal gait, Pain, Decreased activity tolerance, Decreased range of motion, Decreased strength, Increased edema, Difficulty walking, Impaired flexibility  Visit Diagnosis: Acute pain of right knee  Stiffness of right knee, not elsewhere classified  Muscle weakness (generalized)  Localized edema  Other abnormalities of gait and mobility     Problem List Patient Active Problem List   Diagnosis Date Noted  . Breast cancer ([place]) 11/05/2015    Laurie French, PTA 07/21/2017, 10:48 AM  [facility] [address] [place], [place], [address] Phone: [phone]   Fax:  [phone]  Name: Laurie French MRN: [id number] Date of Birth: 1965-04-11

## 2017-07-26 ENCOUNTER — Ambulatory Visit: Payer: BLUE CROSS/BLUE SHIELD | Admitting: Physical Therapy

## 2017-07-26 ENCOUNTER — Encounter: Payer: Self-pay | Admitting: Physical Therapy

## 2017-07-26 DIAGNOSIS — M6281 Muscle weakness (generalized): Secondary | ICD-10-CM

## 2017-07-26 DIAGNOSIS — R600 Localized edema: Secondary | ICD-10-CM

## 2017-07-26 DIAGNOSIS — M25561 Pain in right knee: Secondary | ICD-10-CM

## 2017-07-26 DIAGNOSIS — R2689 Other abnormalities of gait and mobility: Secondary | ICD-10-CM

## 2017-07-26 DIAGNOSIS — M25661 Stiffness of right knee, not elsewhere classified: Secondary | ICD-10-CM | POA: Diagnosis not present

## 2017-07-26 NOTE — Therapy (Signed)
[facility] [address], [place], [place], [address] Phone: [phone]   Fax:  [phone]  Physical Therapy Treatment  Patient Details  Name: Laurie French MRN: [id number] Date of Birth: April 19, 1965 Referring Provider: Maryan Puls, MD   Encounter Date: 07/26/2017  PT End of Session - 07/26/17 1034    Visit Number  12    Date for PT Re-Evaluation  08/05/17    Authorization Type  BCBS- 30 visit limit    Authorization - Visit Number  26    Authorization - Number of Visits  30    PT Start Time  1025    PT Stop Time  1115    PT Time Calculation (min)  50 min    Activity Tolerance  Patient tolerated treatment well    Behavior During Therapy  [facility] for tasks assessed/performed       Past Medical History:  Diagnosis Date  . Cancer ([place])   . Sickle cell anemia (HCC)     Past Surgical History:  Procedure Laterality Date  . ABDOMINAL HYSTERECTOMY    . BREAST SURGERY      There were no vitals filed for this visit.  Subjective Assessment - 07/26/17 1035    Subjective  Pt late, stuck in traffic. No complaints.    Pertinent History  Rt knee arthroscopy: 04/27/17 complex tear of medial meniscus.  Breast cancer- 2018 with lymph node removal.    Currently in Pain?  No/denies    Multiple Pain Sites  No                       [facility] Adult PT Treatment/Exercise - 07/26/17 0001      Knee/Hip Exercises: Aerobic   Recumbent Bike  L3 41min      Knee/Hip Exercises: Machines for Strengthening   Cybex Leg Press  Seat 7: Bil 75# 2x15, RTLE 45# 2x15      Knee/Hip Exercises: Standing   Forward Step Up  Right;2 sets;20 reps;Limitations    Forward Step Up Limitations  Bosu ball    Stairs  4x no rails, reciprocally both directions    SLS  on blue pod 3x30 seconds    Walking with Sports Cord  25# 4 ways: x10 each      Vasopneumatic   Number Minutes Vasopneumatic   15 minutes    Vasopnuematic  Location   Knee    Vasopneumatic Pressure  Medium    Vasopneumatic Temperature   3 flakes               PT Short Term Goals - 07/08/17 1026      PT SHORT TERM GOAL #2   Title  improve Rt LE strength to ascend steps into her house with step-over-step gait    Status  Achieved      PT SHORT TERM GOAL #3   Title  demonstrate symmetry with ambulation on level surface    Status  Achieved        PT Long Term Goals - 07/15/17 1028      PT LONG TERM GOAL #1   Title  be independent in advanced HEP    Time  8    Period  Weeks    Status  On-going    Target Date  08/05/17      PT LONG TERM GOAL #2   Title  demonstrate Rt knee A/ROM flexion to > or = to 110 degrees  to improve squatting and descending steps    Baseline  125    Status  Achieved      PT LONG TERM GOAL #3   Title  demonstrate 4+/5 Rt hip and knee strength to improve endurance and stability    Baseline  5/5    Status  Achieved    Target Date  08/05/17      PT LONG TERM GOAL #4   Title  improve Rt LE strength to ascend and descend steps with step-over-step gait pattern    Baseline  step to gait due to instability    Time  8    Period  Weeks    Status  On-going      PT LONG TERM GOAL #5   Title  reduce FOTO to < or = to 37% limitation    Baseline  47% limitation    Time  8    Period  Weeks    Status  On-going    Target Date  08/05/17            Plan - 07/26/17 1034    Clinical Impression Statement  Pt was a little late today due to traffic. She continues to do well with really no complaints. She tends to avoid stairs still but could perfrom well in clinic today. She is on track for her DC next week.     Rehab Potential  Good    Clinical Impairments Affecting Rehab Potential  none    PT Frequency  2x / week    PT Duration  8 weeks    PT Treatment/Interventions  ADLs/Self Care Home Management;Cryotherapy;Electrical Stimulation;Moist Heat;Therapeutic exercise;Therapeutic activities;Functional  mobility training;Stair training;Gait training;Neuromuscular re-education;Patient/family education;Passive range of motion;Taping;Vasopneumatic Device    PT Next Visit Plan  Quad strength, weight bearing exercises, proprioception    Consulted and Agree with Plan of Care  Patient       Patient will benefit from skilled therapeutic intervention in order to improve the following deficits and impairments:  Abnormal gait, Pain, Decreased activity tolerance, Decreased range of motion, Decreased strength, Increased edema, Difficulty walking, Impaired flexibility  Visit Diagnosis: Acute pain of right knee  Stiffness of right knee, not elsewhere classified  Muscle weakness (generalized)  Localized edema  Other abnormalities of gait and mobility     Problem List Patient Active Problem List   Diagnosis Date Noted  . Breast cancer ([place]) 11/05/2015    Evann Koelzer, PTA 07/26/2017, 10:57 AM  [facility] [address] [place], [place], [address] Phone: [phone]   Fax:  [phone]  Name: ALONNA BARTLING MRN: [id number] Date of Birth: 1965/08/06

## 2017-07-28 ENCOUNTER — Ambulatory Visit: Payer: BLUE CROSS/BLUE SHIELD | Admitting: Physical Therapy

## 2017-07-28 ENCOUNTER — Encounter: Payer: Self-pay | Admitting: Physical Therapy

## 2017-07-28 DIAGNOSIS — M25561 Pain in right knee: Secondary | ICD-10-CM | POA: Diagnosis not present

## 2017-07-28 DIAGNOSIS — M25661 Stiffness of right knee, not elsewhere classified: Secondary | ICD-10-CM

## 2017-07-28 DIAGNOSIS — R2689 Other abnormalities of gait and mobility: Secondary | ICD-10-CM | POA: Diagnosis not present

## 2017-07-28 DIAGNOSIS — R600 Localized edema: Secondary | ICD-10-CM

## 2017-07-28 DIAGNOSIS — M6281 Muscle weakness (generalized): Secondary | ICD-10-CM | POA: Diagnosis not present

## 2017-07-28 NOTE — Therapy (Signed)
[facility] [address], [place], [place], [address] Phone: [phone]   Fax:  [phone]  Physical Therapy Treatment  Patient Details  Name: Laurie French MRN: [id number] Date of Birth: [birth year] Referring Provider: Maryan Puls, MD   Encounter Date: 07/28/2017  PT End of Session - 07/28/17 1104    Visit Number  14    Date for PT Re-Evaluation  08/05/17    Authorization Type  BCBS- 30 visit limit    Authorization - Visit Number  14    Authorization - Number of Visits  30    PT Start Time  4315    PT Stop Time  1024    PT Time Calculation (min)  16 min    Activity Tolerance  Patient tolerated treatment well    Behavior During Therapy  [facility] for tasks assessed/performed       Past Medical History:  Diagnosis Date  . Cancer ([place])   . Sickle cell anemia (HCC)     Past Surgical History:  Procedure Laterality Date  . ABDOMINAL HYSTERECTOMY    . BREAST SURGERY      There were no vitals filed for this visit.  Subjective Assessment - 07/28/17 1105    Subjective  Pt arrived a little early but reports she could not stay long for her treatment as she has to leave to take care of a personal matter.     Pertinent History  Rt knee arthroscopy: 04/27/17 complex tear of medial meniscus.  Breast cancer- 2018 with lymph node removal.    Limitations  Walking;Standing    Currently in Pain?  No/denies    Multiple Pain Sites  No                       [facility] Adult PT Treatment/Exercise - 07/28/17 0001      Knee/Hip Exercises: Aerobic   Recumbent Bike  L3 x 8 min Pt chose to stop at 8 min.      Knee/Hip Exercises: Seated   Long Arc Quad  Strengthening;Right;3 sets;10 reps;Weights    Long Arc Quad Weight  5 lbs.               PT Short Term Goals - 07/08/17 1026      PT SHORT TERM GOAL #2   Title  improve Rt LE strength to ascend steps into her house with step-over-step gait    Status  Achieved      PT SHORT TERM GOAL #3   Title  demonstrate symmetry with ambulation on level surface    Status  Achieved        PT Long Term Goals - 07/28/17 1114      PT LONG TERM GOAL #1   Title  be independent in advanced HEP    Time  8    Period  Weeks    Status  On-going      PT LONG TERM GOAL #4   Title  improve Rt LE strength to ascend and descend steps with step-over-step gait pattern    Time  8    Period  Weeks    Status  Partially Met Performing better this week.            Plan - 07/28/17 1110    Clinical Impression Statement  Pt could only stay for a very abbreviated treatment session today due to having to take care of a personal matter. She had  no complaints with her knee and agreed to increase her LAQ weights to 5#. She tolerated this increase well and reports she plans on buying some ankle weights for home use after her discharge next week.     Rehab Potential  Good    Clinical Impairments Affecting Rehab Potential  none    PT Frequency  2x / week    PT Duration  8 weeks    PT Treatment/Interventions  ADLs/Self Care Home Management;Cryotherapy;Electrical Stimulation;Moist Heat;Therapeutic exercise;Therapeutic activities;Functional mobility training;Stair training;Gait training;Neuromuscular re-education;Patient/family education;Passive range of motion;Taping;Vasopneumatic Device    PT Next Visit Plan  Quad strength, weight bearing exercises, proprioception, DC next week.    Consulted and Agree with Plan of Care  Patient       Patient will benefit from skilled therapeutic intervention in order to improve the following deficits and impairments:  Abnormal gait, Pain, Decreased activity tolerance, Decreased range of motion, Decreased strength, Increased edema, Difficulty walking, Impaired flexibility  Visit Diagnosis: Acute pain of right knee  Stiffness of right knee, not elsewhere classified  Muscle weakness (generalized)  Localized edema  Other  abnormalities of gait and mobility     Problem List Patient Active Problem List   Diagnosis Date Noted  . Breast cancer ([place]) 11/05/2015    Marquee Fuchs, PTA 07/28/2017, 11:16 AM  [facility] [address] [place], [place], [address] Phone: [phone]   Fax:  [phone]  Name: Laurie French MRN: [id number] Date of Birth: May 16, 1965

## 2017-08-02 ENCOUNTER — Ambulatory Visit: Payer: BLUE CROSS/BLUE SHIELD | Admitting: Physical Therapy

## 2017-08-02 ENCOUNTER — Encounter: Payer: Self-pay | Admitting: Physical Therapy

## 2017-08-02 DIAGNOSIS — R2689 Other abnormalities of gait and mobility: Secondary | ICD-10-CM | POA: Diagnosis not present

## 2017-08-02 DIAGNOSIS — M25661 Stiffness of right knee, not elsewhere classified: Secondary | ICD-10-CM

## 2017-08-02 DIAGNOSIS — M25561 Pain in right knee: Secondary | ICD-10-CM | POA: Diagnosis not present

## 2017-08-02 DIAGNOSIS — R600 Localized edema: Secondary | ICD-10-CM | POA: Diagnosis not present

## 2017-08-02 DIAGNOSIS — M6281 Muscle weakness (generalized): Secondary | ICD-10-CM | POA: Diagnosis not present

## 2017-08-02 NOTE — Therapy (Addendum)
[facility] [address] [place], [place], [address] Phone: [phone]   Fax:  [phone]  Physical Therapy Treatment  Patient Details  Name: Laurie French MRN: [id number] Date of Birth: 09-06-1965 Referring Provider: Maryan Puls, MD   Encounter Date: 08/02/2017  PT End of Session - 08/02/17 1027    Visit Number  15    Date for PT Re-Evaluation  08/05/17    Authorization Type  BCBS- 30 visit limit    Authorization - Visit Number  15    Authorization - Number of Visits  30    PT Start Time  1856    PT Stop Time  1115    PT Time Calculation (min)  53 min    Activity Tolerance  Patient tolerated treatment well    Behavior During Therapy  [facility] for tasks assessed/performed       Past Medical History:  Diagnosis Date  . Cancer ([place])   . Sickle cell anemia (HCC)     Past Surgical History:  Procedure Laterality Date  . ABDOMINAL HYSTERECTOMY    . BREAST SURGERY      There were no vitals filed for this visit.  Subjective Assessment - 08/02/17 1029    Subjective  No new complaints today.     Pertinent History  Rt knee arthroscopy: 04/27/17 complex tear of medial meniscus.  Breast cancer- 2018 with lymph node removal.    Limitations  Walking;Standing    Currently in Pain?  No/denies    Multiple Pain Sites  No                       [facility] Adult PT Treatment/Exercise - 08/02/17 0001      Knee/Hip Exercises: Aerobic   Recumbent Bike  L3 x 10 min Pt chose to stop at 8 min.      Knee/Hip Exercises: Machines for Strengthening   Cybex Leg Press  Seat 7: Bil 75# 2x15, RTLE 45# 2x15      Knee/Hip Exercises: Standing   Forward Step Up  Right;2 sets;20 reps;Limitations    Forward Step Up Limitations  Bosu ball    Stairs  4x no rails, reciprocally both directions    Walking with Sports Cord  25# 4 ways: x10 each      Knee/Hip Exercises: Seated   Long Arc Quad  Strengthening;Right;3  sets;10 reps;Weights    Long Arc Quad Weight  5 lbs.      Vasopneumatic   Number Minutes Vasopneumatic   15 minutes    Vasopnuematic Location   Knee    Vasopneumatic Pressure  Medium    Vasopneumatic Temperature   3 flakes               PT Short Term Goals - 07/08/17 1026      PT SHORT TERM GOAL #2   Title  improve Rt LE strength to ascend steps into her house with step-over-step gait    Status  Achieved      PT SHORT TERM GOAL #3   Title  demonstrate symmetry with ambulation on level surface    Status  Achieved        PT Long Term Goals - 07/28/17 1114      PT LONG TERM GOAL #1   Title  be independent in advanced HEP    Time  8    Period  Weeks    Status  On-going  PT LONG TERM GOAL #4   Title  improve Rt LE strength to ascend and descend steps with step-over-step gait pattern    Time  8    Period  Weeks    Status  Partially Met Performing better this week.            Plan - 08/02/17 1027    Clinical Impression Statement  Pt reports she is "99%" better since her evaluation. She is doing all her work duties as well as her recreational activites. Mild edema remains. Goals will be formally checked on her DC next visit.     Rehab Potential  Good    Clinical Impairments Affecting Rehab Potential  none    PT Frequency  2x / week    PT Duration  8 weeks    PT Treatment/Interventions  ADLs/Self Care Home Management;Cryotherapy;Electrical Stimulation;Moist Heat;Therapeutic exercise;Therapeutic activities;Functional mobility training;Stair training;Gait training;Neuromuscular re-education;Patient/family education;Passive range of motion;Taping;Vasopneumatic Device    PT Next Visit Plan  DC next session, FOTO    Consulted and Agree with Plan of Care  Patient       Patient will benefit from skilled therapeutic intervention in order to improve the following deficits and impairments:  Abnormal gait, Pain, Decreased activity tolerance, Decreased range of motion,  Decreased strength, Increased edema, Difficulty walking, Impaired flexibility  Visit Diagnosis: Acute pain of right knee  Stiffness of right knee, not elsewhere classified  Muscle weakness (generalized)  Localized edema  Other abnormalities of gait and mobility     Problem List Patient Active Problem List   Diagnosis Date Noted  . Breast cancer ([place]) 11/05/2015    Shavy Beachem, PTA 08/02/2017, 10:58 AM PHYSICAL THERAPY DISCHARGE SUMMARY  Visits from Start of Care: 15  Current functional level related to goals / functional outcomes: See above for current PT status.  Pt agreed to discharge to HEP.  She missed her final PT session.  Remaining deficits: Pt denies any functional deficits.   Education / Equipment: HEP Plan: Patient agrees to discharge.  Patient goals were partially met. Patient is being discharged due to meeting the stated rehab goals.  ?????        Sigurd Sos, PT 08/04/17 11:27 AM   [facility] [address] [place], [place], [address] Phone: [phone]   Fax:  [phone]  Name: Laurie French MRN: [id number] Date of Birth: Jun 04, 1965

## 2017-08-04 ENCOUNTER — Telehealth: Payer: Self-pay

## 2017-08-04 ENCOUNTER — Ambulatory Visit: Payer: BLUE CROSS/BLUE SHIELD

## 2017-08-04 NOTE — Telephone Encounter (Signed)
PT called pt due to missed appointment today.  Pt thought her appointment was tomorrow.  Pt requested D/C at this time.

## 2017-11-28 ENCOUNTER — Other Ambulatory Visit: Payer: Self-pay

## 2017-11-28 ENCOUNTER — Emergency Department (HOSPITAL_COMMUNITY)
Admission: EM | Admit: 2017-11-28 | Discharge: 2017-11-28 | Disposition: A | Discharge status: Home / Self Care | Payer: Self-pay | Attending: Emergency Medicine | Admitting: Emergency Medicine

## 2017-11-28 ENCOUNTER — Emergency Department (HOSPITAL_COMMUNITY): Payer: Self-pay

## 2017-11-28 ENCOUNTER — Encounter (HOSPITAL_COMMUNITY): Payer: Self-pay | Admitting: Emergency Medicine

## 2017-11-28 DIAGNOSIS — Z91040 Latex allergy status: Secondary | ICD-10-CM | POA: Insufficient documentation

## 2017-11-28 DIAGNOSIS — Z79899 Other long term (current) drug therapy: Secondary | ICD-10-CM | POA: Insufficient documentation

## 2017-11-28 DIAGNOSIS — F17200 Nicotine dependence, unspecified, uncomplicated: Secondary | ICD-10-CM | POA: Insufficient documentation

## 2017-11-28 DIAGNOSIS — R1084 Generalized abdominal pain: Secondary | ICD-10-CM | POA: Insufficient documentation

## 2017-11-28 DIAGNOSIS — Z853 Personal history of malignant neoplasm of breast: Secondary | ICD-10-CM | POA: Insufficient documentation

## 2017-11-28 LAB — CBC
HEMATOCRIT: 34.7 % — AB (ref 36.0–46.0)
HEMOGLOBIN: 11.1 g/dL — AB (ref 12.0–15.0)
MCH: 20.2 pg — ABNORMAL LOW (ref 26.0–34.0)
MCHC: 32 g/dL (ref 30.0–36.0)
MCV: 63.2 fL — ABNORMAL LOW (ref 78.0–100.0)
Platelets: 408 10*3/uL — ABNORMAL HIGH (ref 150–400)
RBC: 5.49 MIL/uL — ABNORMAL HIGH (ref 3.87–5.11)
RDW: 16.6 % — AB (ref 11.5–15.5)
WBC: 13.7 10*3/uL — AB (ref 4.0–10.5)

## 2017-11-28 LAB — COMPREHENSIVE METABOLIC PANEL
ALBUMIN: 3.6 g/dL (ref 3.5–5.0)
ALK PHOS: 78 U/L (ref 38–126)
ALT: 13 U/L (ref 0–44)
ANION GAP: 10 (ref 5–15)
AST: 19 U/L (ref 15–41)
BILIRUBIN TOTAL: 0.7 mg/dL (ref 0.3–1.2)
BUN: 11 mg/dL (ref 6–20)
CALCIUM: 10.1 mg/dL (ref 8.9–10.3)
CO2: 28 mmol/L (ref 22–32)
Chloride: 103 mmol/L (ref 98–111)
Creatinine, Ser: 0.86 mg/dL (ref 0.44–1.00)
GFR calc Af Amer: >60 mL/min (ref >60)
GLUCOSE: 142 mg/dL — AB (ref 70–99)
POTASSIUM: 3.2 mmol/L — AB (ref 3.5–5.1)
Sodium: 141 mmol/L (ref 135–145)
TOTAL PROTEIN: 7.5 g/dL (ref 6.5–8.1)

## 2017-11-28 LAB — RETICULOCYTES
RBC.: 5.49 MIL/uL — ABNORMAL HIGH (ref 3.87–5.11)
Retic Count, Absolute: 71.4 10*3/uL (ref 19.0–186.0)
Retic Ct Pct: 1.3 % (ref 0.4–3.1)

## 2017-11-28 LAB — URINALYSIS, ROUTINE W REFLEX MICROSCOPIC
BILIRUBIN URINE: NEGATIVE
Glucose, UA: NEGATIVE mg/dL
HGB URINE DIPSTICK: NEGATIVE
KETONES UR: NEGATIVE mg/dL
NITRITE: NEGATIVE
PH: 5 (ref 5.0–8.0)
Protein, ur: NEGATIVE mg/dL
SPECIFIC GRAVITY, URINE: 1.013 (ref 1.005–1.030)

## 2017-11-28 LAB — LIPASE, BLOOD: Lipase: 35 U/L (ref 11–51)

## 2017-11-28 MED ORDER — DICYCLOMINE HCL 20 MG PO TABS: 20.0000 mg | ORAL_TABLET | Freq: Two times a day (BID) | ORAL | 0 refills | Status: DC

## 2017-11-28 NOTE — Discharge Instructions (Addendum)
Try Bentyl for your abdominal pain.  Contact GI to schedule follow-up.  Return to ER for worsening or concerning symptoms.

## 2017-11-28 NOTE — ED Triage Notes (Signed)
Pt c/o R/L abdominal and R/L back pain x 1 month. Pt denies N/V, but c/o pressure with urinating.

## 2017-11-28 NOTE — ED Provider Notes (Signed)
[facility] DEPT Provider Note   CSN: [id number] Arrival date & time: 11/28/17  1855     History   Chief Complaint Chief Complaint  Patient presents with  . Abdominal Pain  . Back Pain    HPI Laurie French is a 52 y.o. female.  52 year old female presents with complaint of back and abdominal discomfort x 6 to 8 months.  Patient has been trying various herbal supplements and cleanses at home but continues to have discomfort in her abdomen which is constant but does not get worse with food, no changes in bowel habits.  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  Patient also notes dysuria with foul odor to her urine for the past week and a half.  Patient reports history of breast cancer, treated with chemotherapy, last treated a year and a half ago and was advised to continue with hormone medication however states that she does not wish to take this and instead is taking various herbal medications.  Reports occasional nausea, denies vomiting, denies fevers or chills.  No other complaints or concerns today.     Past Medical History:  Diagnosis Date  . Cancer ([place])   . Sickle cell anemia [facility])     Patient Active Problem List   Diagnosis Date Noted  . Breast cancer ([place]) 11/05/2015    Past Surgical History:  Procedure Laterality Date  . ABDOMINAL HYSTERECTOMY    . BREAST SURGERY       OB History   None      Home Medications    Prior to Admission medications   Medication Sig Start Date End Date Taking? Authorizing Provider  dicyclomine (BENTYL) 20 MG tablet Take 1 tablet (20 mg total) by mouth 2 (two) times daily. 11/28/17   Tacy Learn, PA-C  naproxen (NAPROSYN) 500 MG tablet Take 1 tablet (500 mg total) by mouth 2 (two) times daily as needed for mild pain, moderate pain  or headache (TAKE WITH MEALS.). 09/23/16   Street, Mercedes, PA-C  ondansetron (ZOFRAN) 4 MG tablet Take 4 mg by mouth every 8 (eight) hours as needed for nausea or vomiting.    [provider]  OVER THE COUNTER MEDICATION Apply 1 application topically daily as needed. For irritation   Deep roots healing cream    [provider]  ranitidine (ZANTAC) 150 MG tablet Take 1 tablet (150 mg total) by mouth 2 (two) times daily. 11/05/15   McVey, Gelene Mink, PA-C    Family History History reviewed. No pertinent family history.  Social History Social History   Tobacco Use  . Smoking status: Current Every Day Smoker  . Smokeless tobacco: Never Used  Substance Use Topics  . Alcohol use: Yes  . Drug use: No     Allergies   Latex   Review of Systems Review of Systems  Constitutional: Negative for chills and fever.  Respiratory: Negative for shortness of breath.   Cardiovascular: Negative for chest pain.  Gastrointestinal: Positive for abdominal pain and nausea. Negative for blood in stool, constipation, diarrhea and vomiting.  Genitourinary: Positive for dysuria. Negative for frequency and urgency.  Musculoskeletal: Positive for back pain. Negative for arthralgias and myalgias.  Skin: Negative for rash and wound.  Allergic/Immunologic: Positive for immunocompromised state.  Neurological: Negative for dizziness, weakness and headaches.  Hematological: Does not bruise/bleed easily.  Psychiatric/Behavioral: Negative for confusion.  All other systems reviewed and are negative.    Physical Exam Updated Vital Signs BP 128/62 (BP Location: Left Arm)   Pulse 82   Temp 98.2 F (36.8 C) (Oral)   Resp 16   SpO2 100%   Physical Exam  Constitutional: She is oriented to person, place, and time. She appears well-developed and well-nourished. No distress.  HENT:  Head: Normocephalic and atraumatic.  Cardiovascular: Normal rate, regular rhythm, normal heart sounds and  intact distal pulses.  Pulmonary/Chest: Effort normal and breath sounds normal.  Abdominal: Soft. Normal appearance and bowel sounds are normal. There is no tenderness. There is no CVA tenderness and negative Murphy's sign.  Neurological: She is alert and oriented to person, place, and time.  Skin: Skin is warm and dry. She is not diaphoretic.  Psychiatric: She has a normal mood and affect. Her behavior is normal.  Nursing note and vitals reviewed.    ED Treatments / Results  Labs (all labs ordered are listed, but only abnormal results are displayed) Labs Reviewed  COMPREHENSIVE METABOLIC PANEL - Abnormal; Notable for the following components:      Result Value   Potassium 3.2 (*)    Glucose, Bld 142 (*)    All other components within normal limits  CBC - Abnormal; Notable for the following components:   WBC 13.7 (*)    RBC 5.49 (*)    Hemoglobin 11.1 (*)    HCT 34.7 (*)    MCV 63.2 (*)    MCH 20.2 (*)    RDW 16.6 (*)    Platelets 408 (*)    All other components within normal limits  URINALYSIS, ROUTINE W REFLEX MICROSCOPIC - Abnormal; Notable for the following components:   Leukocytes, UA TRACE (*)    Bacteria, UA RARE (*)    All other components within normal limits  RETICULOCYTES - Abnormal; Notable for the following components:   RBC. 5.49 (*)    All other components within normal limits  LIPASE, BLOOD    EKG None  Radiology Dg Abd Acute W/chest  Result Date: 11/28/2017 CLINICAL DATA:  Abdominal pain for 1 month. EXAM: DG ABDOMEN ACUTE W/ 1V CHEST COMPARISON:  03/14/2009 chest radiograph FINDINGS: The cardiomediastinal silhouette is unremarkable. The lungs are clear. No airspace disease, pleural effusion or pneumothorax. The bowel gas pattern is unremarkable. There is no evidence of bowel obstruction or pneumoperitoneum. No suspicious calcifications are present. No acute bony abnormalities are noted. IMPRESSION: Negative abdominal radiographs.  No acute cardiopulmonary  disease. Electronically Signed   By: Margarette Canada M.D.   On: 11/28/2017 22:40    Procedures Procedures (including critical care time)  Medications Ordered in ED Medications - No data to display   Initial Impression / Assessment and Plan / ED Course  I have reviewed the triage vital signs and the nursing notes.  Pertinent labs & imaging results that were available during my care of the patient were reviewed by me and considered in my medical decision making (see chart for details).  Clinical Course as of Sep 09 0000  Sun Nov 29, 4550  7425 52 year old female with history of breast cancer, treated with chemotherapy, not currently undergoing therapy also history of sickle cell anemia presents with complaint of diffuse abdominal discomfort for the past 6 to 8 months.  No changes in bowel habits, occasional nausea without vomiting, reports dysuria and odor to her urine, denies frequency or urgency.  Abdomen is soft and nontender, urinalysis with trace leukocytes rare bacteria, continue with 11-20 epithelials, reticulocyte count normal, CBC with mild leukocytosis at 13.7, known anemia with hemoglobin of 11.1, CMP with mild hypokalemia 3.2.  Discussed results with patient, x-ray chest and abdomen unremarkable.  She was given referral to GI, agrees to contact them for follow-up.  Also given prescription for Bentyl which she states she likely will not take if she does not like to take medications and prefers herbal supplements.  Patient is aware that her herbal supplements and cleanses may be contributing to her abdominal discomfort.  Recommend return to ER for worsening or concerning symptoms.   [LM]    Clinical Course User Index [LM] Tacy Learn, PA-C    Final Clinical Impressions(s) / ED Diagnoses   Final diagnoses:  Generalized abdominal pain    ED Discharge Orders         Ordered    dicyclomine (BENTYL) 20 MG tablet  2 times daily     11/28/17 2256           Tacy Learn,  PA-C 11/29/17 0000    Valarie Merino, MD 12/02/17 2131

## 2017-11-29 ENCOUNTER — Emergency Department (HOSPITAL_COMMUNITY)
Admission: EM | Admit: 2017-11-29 | Discharge: 2017-11-29 | Discharge status: Against Medical Advice | Payer: Self-pay | Attending: Emergency Medicine | Admitting: Emergency Medicine

## 2017-11-29 DIAGNOSIS — R109 Unspecified abdominal pain: Secondary | ICD-10-CM | POA: Insufficient documentation

## 2017-11-29 DIAGNOSIS — F17200 Nicotine dependence, unspecified, uncomplicated: Secondary | ICD-10-CM | POA: Insufficient documentation

## 2017-11-29 DIAGNOSIS — Z79899 Other long term (current) drug therapy: Secondary | ICD-10-CM | POA: Insufficient documentation

## 2017-11-29 DIAGNOSIS — Z91040 Latex allergy status: Secondary | ICD-10-CM | POA: Insufficient documentation

## 2017-11-29 NOTE — ED Notes (Addendum)
patient is refusing vital signs and writer spoke to Dr. Venora Maples regarding patient just wanting advise and what she was being prescribed because they did not help her last night. Patient stated that we would not help her because she was a black woman. Patient wanted to know what we were typing about her. Dr. Venora Maples notified and Dr. Venora Maples went into the room to speak with the patient.

## 2017-11-29 NOTE — ED Provider Notes (Signed)
[facility] DEPT Provider Note   CSN: [id number] Arrival date & time: 11/29/17  1102     History   Chief Complaint No chief complaint on file.  Level V caveat: uncooperative   HPI Laurie French is a 52 y.o. female.  HPI Pt seen and evaluated in the ER yesterday for abdominal pain. Returns to the ER today requesting to know why she was not prescribed an antibiotic despite her WBC count being 13,000.   She provides no more history regarding her abdominal pain, the location, the timing, the severity.  She is frustrated with the lack of explanation yesterday.    She no longer wants to be in the ER. She would like to go home. She is refusing vital signs. Refusing physical examination.   Past Medical History:  Diagnosis Date  . Cancer ([place])   . Sickle cell anemia [facility])     Patient Active Problem List   Diagnosis Date Noted  . Breast cancer ([place]) 11/05/2015    Past Surgical History:  Procedure Laterality Date  . ABDOMINAL HYSTERECTOMY    . BREAST SURGERY       OB History   None      Home Medications    Prior to Admission medications   Medication Sig Start Date End Date Taking? Authorizing Provider  dicyclomine (BENTYL) 20 MG tablet Take 1 tablet (20 mg total) by mouth 2 (two) times daily. 11/28/17   Tacy Learn, PA-C  naproxen (NAPROSYN) 500 MG tablet Take 1 tablet (500 mg total) by mouth 2 (two) times daily as needed for mild pain, moderate pain or headache (TAKE WITH MEALS.). 09/23/16   Street, Mercedes, PA-C  ondansetron (ZOFRAN) 4 MG tablet Take 4 mg by mouth every 8 (eight) hours as needed for nausea or vomiting.    [provider]  OVER THE COUNTER MEDICATION Apply 1 application topically daily as needed. For irritation   Deep roots healing cream    [provider]  ranitidine (ZANTAC) 150 MG tablet Take 1 tablet (150 mg total) by mouth 2 (two) times daily. 11/05/15   McVey, Gelene Mink, PA-C    Family  History No family history on file.  Social History Social History   Tobacco Use  . Smoking status: Current Every Day Smoker  . Smokeless tobacco: Never Used  Substance Use Topics  . Alcohol use: Yes  . Drug use: No     Allergies   Latex   Review of Systems Review of Systems  Unable to perform ROS: Other     Physical Exam Updated Vital Signs There were no vitals taken for this visit.  Physical Exam  Constitutional: She is oriented to person, place, and time. She appears well-developed and well-nourished.  HENT:  Head: Normocephalic.  Eyes: EOM are normal.  Neck: Normal range of motion.  Pulmonary/Chest: Effort normal.  Abdominal: She exhibits no distension.  Musculoskeletal: Normal range of motion.  Neurological: She is alert and oriented to person, place, and time.  Psychiatric:  tearful  Nursing note and vitals reviewed.    ED Treatments / Results  Labs (all labs ordered are listed, but only abnormal results are displayed) Labs Reviewed - No data to display  EKG None  Radiology Dg Abd Acute W/chest  Result Date: 11/28/2017 CLINICAL DATA:  Abdominal pain for 1 month. EXAM: DG ABDOMEN ACUTE W/ 1V CHEST COMPARISON:  03/14/2009 chest radiograph FINDINGS: The cardiomediastinal silhouette is unremarkable. The lungs are clear. No airspace disease,  pleural effusion or pneumothorax. The bowel gas pattern is unremarkable. There is no evidence of bowel obstruction or pneumoperitoneum. No suspicious calcifications are present. No acute bony abnormalities are noted. IMPRESSION: Negative abdominal radiographs.  No acute cardiopulmonary disease. Electronically Signed   By: Margarette Canada M.D.   On: 11/28/2017 22:40    Procedures Procedures (including critical care time)  Medications Ordered in ED Medications - No data to display   Initial Impression / Assessment and Plan / ED Course  I have reviewed the triage vital signs and the nursing notes.  Pertinent labs &  imaging results that were available during my care of the patient were reviewed by me and considered in my medical decision making (see chart for details).     I reviewed yesterdays lab work with the patient  She refuses further evlauation in the ER.   She understands that this is an incomplete evaluation in the ER and I am unable to further evaluate the cause of her abdominal pain and symptoms.   She is encouraged to return to the ER for new or worsening symptoms.  She is encouraged to return to the ER if she would like further evaluation as to the cause of her symptoms  Final Clinical Impressions(s) / ED Diagnoses   Final diagnoses:  Abdominal pain, unspecified abdominal location    ED Discharge Orders    None       Jola Schmidt, MD 11/29/17 1141

## 2017-11-29 NOTE — ED Notes (Signed)
Bed: [facility]PT1 Expected date:  Expected time:  Means of arrival:  Comments: 

## 2017-11-29 NOTE — ED Notes (Signed)
Pt declines having vitals taken at this time

## 2017-12-21 ENCOUNTER — Ambulatory Visit: Payer: Self-pay | Admitting: Gastroenterology

## 2017-12-21 ENCOUNTER — Encounter: Payer: Self-pay | Admitting: Gastroenterology

## 2017-12-21 ENCOUNTER — Ambulatory Visit (INDEPENDENT_AMBULATORY_CARE_PROVIDER_SITE_OTHER): Payer: Self-pay | Admitting: Gastroenterology

## 2017-12-21 VITALS — BP 112/88 | HR 80 | Ht 64.0 in | Wt 243.0 lb

## 2017-12-21 DIAGNOSIS — Z1211 Encounter for screening for malignant neoplasm of colon: Secondary | ICD-10-CM

## 2017-12-21 DIAGNOSIS — R140 Abdominal distension (gaseous): Secondary | ICD-10-CM

## 2017-12-21 NOTE — Patient Instructions (Signed)
If you are age 50 or older, your body mass index should be between 23-30. Your Body mass index is 41.71 kg/m. If this is out of the aforementioned range listed, please consider follow up with your Primary Care Provider.  If you are age 77 or younger, your body mass index should be between 19-25. Your Body mass index is 41.71 kg/m. If this is out of the aformentioned range listed, please consider follow up with your Primary Care Provider.   Please call us to schedule a screening colonoscopy at the age of 4.   It was a pleasure to see you today!  Dr. Loletha Carrow

## 2017-12-21 NOTE — Progress Notes (Signed)
[facility] Consult Note:  History: Laurie French 12/21/2017  Referring physician: Referred by the physician at [facility] emergency department after a visit there last month  Reason for consult/chief complaint: Abdominal Pain (periumbilical pain; onset 1 month; hosp in Sept - labs in [place]; pts smells an odor when she urinates)   Subjective  HPI:  This is a 52 year old woman referred after recent visit to the emergency department.  She presented to the emergency department at [facility] on 11/28/2017 with at least 6 months of back and abdominal pain described in that providers note.  She also described a foul odor to the urine. She has a history of sickle cell trait and breast cancer and reportedly takes herbal supplements instead of hormone therapy.  She was given a prescription for dicyclomine by the ED physician. She then presented to the emergency department the following day, returning to the ED asking why she was not prescribed an antibiotic for her elevated WBC of 13,000  (chart review shows it is been elevated as far back as 2011).  She then left without being seen or even having vital signs taken or physical examination. She missed her appointment earlier this morning and then was rescheduled to an appointment later in the morning. Last office note from [facility] hematology and oncology on 10/20/2017 notes that patient left before seeing the provider.   She is a tangential historian, but reports that the dull periumbilic abdominal discomfort she previously had has now resolved.  She was also having some back pain that she thinks may be from her mattress.  She has other symptoms such as scribing that her perspiration smells unusual and thinks this may still be "the chemotherapy getting out of my body".  She last had treatment for breast cancer in 2017.  She still feels that her urine has an odd odor, but she has no dysuria or hematuria. Her bowel habits are regular  without rectal bleeding, she denies heartburn, dysphagia, odynophagia, nausea, vomiting, early satiety or weight loss. Roy still concerned that she may have some kind of infection, and was particularly concerned about her WBC of 13,000 at the ED visit.  ROS:  Review of Systems  Denies chest pain or dyspnea Intermittent bloating and she reports recent weight gain.  past Medical History: Past Medical History:  Diagnosis Date  . Cancer [facility]) 2017   breast  . Sickle cell anemia ([place])      Past Surgical History: Past Surgical History:  Procedure Laterality Date  . ABDOMINAL HYSTERECTOMY    . BREAST SURGERY       Family History: Family History  Problem Relation Age of Onset  . Kidney failure Mother   . Cancer Paternal Grandfather   . Colon cancer Neg Hx     Social History: Social History   Socioeconomic History  . Marital status: Divorced    Spouse name: Not on file  . Number of children: Not on file  . Years of education: Not on file  . Highest education level: Not on file  Occupational History  . Not on file  Social Needs  . Financial resource strain: Not on file  . Food insecurity:    Worry: Not on file    Inability: Not on file  . Transportation needs:    Medical: Not on file    Non-medical: Not on file  Tobacco Use  . Smoking status: Former Research scientist (life sciences)  . Smokeless tobacco: Never Used  Substance and Sexual Activity  .  Alcohol use: Yes  . Drug use: No  . Sexual activity: Yes  Lifestyle  . Physical activity:    Days per week: Not on file    Minutes per session: Not on file  . Stress: Not on file  Relationships  . Social connections:    Talks on phone: Not on file    Gets together: Not on file    Attends religious service: Not on file    Active member of club or organization: Not on file    Attends meetings of clubs or organizations: Not on file    Relationship status: Not on file  Other Topics Concern  . Not on file  Social History Narrative  .  Not on file    Allergies: Allergies  Allergen Reactions  . Latex Itching and Rash    Outpatient Meds: No current outpatient medications on file.   No current facility-administered medications for this visit.       ___________________________________________________________________ Objective   Exam:  BP 112/88   Pulse 80   Ht 5\' 4"  (1.626 m)   Wt 243 lb (110.2 kg)   BMI 41.71 kg/m    General: this is a(n) pleasant and well-appearing woman  Eyes: sclera anicteric, no redness  ENT: oral mucosa moist without lesions, no cervical or supraclavicular lymphadenopathy, good dentition  CV: RRR without murmur, S1/S2, no JVD, no peripheral edema  Resp: clear to auscultation bilaterally, normal RR and effort noted  GI: soft, no tenderness, with active bowel sounds. No guarding or palpable organomegaly noted.  Skin; warm and dry, no rash or jaundice noted  Neuro: awake, alert and oriented x 3. Normal gross motor function and fluent speech  Labs:  CBC Latest Ref Rng & Units 11/28/2017 06/14/2009 06/13/2009  WBC 4.0 - 10.5 K/uL 13.7(H) 13.6(H) 15.3(H)  Hemoglobin 12.0 - 15.0 g/dL 11.1(L) 9.5(L) 10.6(L)  Hematocrit 36.0 - 46.0 % 34.7(L) 29.5(L) 34.5(L)  Platelets 150 - 400 K/uL 408(H) 446(H) 441(H)   CMP Latest Ref Rng & Units 11/28/2017 06/14/2009 06/13/2009  Glucose 70 - 99 mg/dL 142(H) 137(H) 109(H)  BUN 6 - 20 mg/dL 11 2(L) 4(L)  Creatinine 0.44 - 1.00 mg/dL 0.86 0.59 0.60  Sodium 135 - 145 mmol/L 141 134(L) 135  Potassium 3.5 - 5.1 mmol/L 3.2(L) 3.5 3.8  Chloride 98 - 111 mmol/L 103 102 104  CO2 22 - 32 mmol/L 28 27 26   Calcium 8.9 - 10.3 mg/dL 10.1 8.3(L) 9.4  Total Protein 6.5 - 8.1 g/dL 7.5 - -  Total Bilirubin 0.3 - 1.2 mg/dL 0.7 - -  Alkaline Phos 38 - 126 U/L 78 - -  AST 15 - 41 U/L 19 - -  ALT 0 - 44 U/L 13 - -   Urinalysis 11/28/2017 (in emergency department) normal pH, negative protein, trace LE, rare bacteria, positive squamous epithelial cells 0-5  WBC  Radiologic Studies:  Normal CXR and KUB 11/28/17  Assessment: Encounter Diagnoses  Name Primary?  . Abdominal bloating Yes  . Special screening for malignant neoplasms, colon     The symptoms that brought her to the ED are difficult to characterize, but she does not appear to have a primary digestive condition.  I do not know what to make of her reported abnormal urine odor, but her ER testing did not indicate UTI.  Plan:  I have encouraged her to seek primary care evaluation, and she states that she is in the process of getting a new provider since she just got insurance.  For her health maintenance, I recommended a screening colonoscopy after she turns 52 soon.  She seemed inclined to do so, and wanted our contact information so she can let us know when she is ready to do that.   Nelida Meuse III

## 2018-02-04 ENCOUNTER — Other Ambulatory Visit: Payer: BLUE CROSS/BLUE SHIELD

## 2018-02-04 ENCOUNTER — Other Ambulatory Visit (HOSPITAL_COMMUNITY)
Admission: RE | Admit: 2018-02-04 | Discharge: 2018-02-04 | Disposition: A | Discharge status: Home / Self Care | Payer: BLUE CROSS/BLUE SHIELD | Source: Ambulatory Visit | Attending: Family | Admitting: Family

## 2018-02-04 ENCOUNTER — Ambulatory Visit: Payer: BLUE CROSS/BLUE SHIELD | Admitting: Family

## 2018-02-04 ENCOUNTER — Encounter: Payer: Self-pay | Admitting: Family

## 2018-02-04 ENCOUNTER — Ambulatory Visit: Payer: Self-pay | Admitting: Family

## 2018-02-04 ENCOUNTER — Other Ambulatory Visit (INDEPENDENT_AMBULATORY_CARE_PROVIDER_SITE_OTHER): Payer: BLUE CROSS/BLUE SHIELD

## 2018-02-04 VITALS — BP 116/80 | HR 84 | Temp 98.1°F | Ht 64.0 in | Wt 243.1 lb

## 2018-02-04 DIAGNOSIS — R635 Abnormal weight gain: Secondary | ICD-10-CM | POA: Diagnosis not present

## 2018-02-04 DIAGNOSIS — Z0000 Encounter for general adult medical examination without abnormal findings: Secondary | ICD-10-CM

## 2018-02-04 DIAGNOSIS — Z1211 Encounter for screening for malignant neoplasm of colon: Secondary | ICD-10-CM

## 2018-02-04 DIAGNOSIS — Z13220 Encounter for screening for lipoid disorders: Secondary | ICD-10-CM | POA: Diagnosis not present

## 2018-02-04 DIAGNOSIS — N898 Other specified noninflammatory disorders of vagina: Secondary | ICD-10-CM | POA: Diagnosis not present

## 2018-02-04 DIAGNOSIS — R5383 Other fatigue: Secondary | ICD-10-CM | POA: Diagnosis not present

## 2018-02-04 LAB — COMPREHENSIVE METABOLIC PANEL
ALBUMIN: 4 g/dL (ref 3.5–5.2)
ALK PHOS: 79 U/L (ref 39–117)
ALT: 9 U/L (ref 0–35)
AST: 12 U/L (ref 0–37)
BILIRUBIN TOTAL: 0.2 mg/dL (ref 0.2–1.2)
BUN: 17 mg/dL (ref 6–23)
CALCIUM: 10.7 mg/dL — AB (ref 8.4–10.5)
CHLORIDE: 105 meq/L (ref 96–112)
CO2: 27 mEq/L (ref 19–32)
CREATININE: 1.01 mg/dL (ref 0.40–1.20)
GFR: 74.62 mL/min (ref >60.00)
Glucose, Bld: 93 mg/dL (ref 70–99)
Potassium: 4.2 mEq/L (ref 3.5–5.1)
Sodium: 140 mEq/L (ref 135–145)
TOTAL PROTEIN: 7.7 g/dL (ref 6.0–8.3)

## 2018-02-04 LAB — LIPID PANEL
CHOLESTEROL: 203 mg/dL — AB (ref 0–200)
HDL: 50.7 mg/dL (ref >39.00)
LDL CALC: 133 mg/dL — AB (ref 0–99)
NonHDL: 152.21
TRIGLYCERIDES: 96 mg/dL (ref 0.0–149.0)
Total CHOL/HDL Ratio: 4
VLDL: 19.2 mg/dL (ref 0.0–40.0)

## 2018-02-04 LAB — CBC WITH DIFFERENTIAL/PLATELET
BASOS ABS: 0.1 10*3/uL (ref 0.0–0.1)
Basophils Relative: 0.7 % (ref 0.0–3.0)
EOS ABS: 0.2 10*3/uL (ref 0.0–0.7)
Eosinophils Relative: 1.6 % (ref 0.0–5.0)
HEMATOCRIT: 35.2 % — AB (ref 36.0–46.0)
HEMOGLOBIN: 11 g/dL — AB (ref 12.0–15.0)
LYMPHS PCT: 24.2 % (ref 12.0–46.0)
Lymphs Abs: 3.3 10*3/uL (ref 0.7–4.0)
MCHC: 31.4 g/dL (ref 30.0–36.0)
MCV: 62 fl — ABNORMAL LOW (ref 78.0–100.0)
MONO ABS: 0.6 10*3/uL (ref 0.1–1.0)
Monocytes Relative: 4.5 % (ref 3.0–12.0)
Neutro Abs: 9.3 10*3/uL — ABNORMAL HIGH (ref 1.4–7.7)
Neutrophils Relative %: 69 % (ref 43.0–77.0)
Platelets: 383 10*3/uL (ref 150.0–400.0)
RBC: 5.67 Mil/uL — ABNORMAL HIGH (ref 3.87–5.11)
RDW: 17.2 % — ABNORMAL HIGH (ref 11.5–15.5)
WBC: 13.5 10*3/uL — AB (ref 4.0–10.5)

## 2018-02-04 LAB — HEMOGLOBIN A1C: HEMOGLOBIN A1C: 6.1 % (ref 4.6–6.5)

## 2018-02-04 LAB — VITAMIN D 25 HYDROXY (VIT D DEFICIENCY, FRACTURES): VITD: 13.1 ng/mL — ABNORMAL LOW (ref 30.00–100.00)

## 2018-02-04 LAB — TSH: TSH: 1.66 u[IU]/mL (ref 0.35–4.50)

## 2018-02-04 MED ORDER — METRONIDAZOLE 0.75 % VA GEL: 1.0000 | Freq: Every day | VAGINAL | 0 refills | Status: AC

## 2018-02-04 NOTE — Progress Notes (Signed)
Laurie French is a 52 y.o. female with the following history as recorded in Epic[employer]:  Patient Active Problem List   Diagnosis Date Noted  . Breast cancer ([place]) 11/05/2015    Current Outpatient Medications  Medication Sig Dispense Refill  . letrozole (FEMARA) 2.5 MG tablet Take by mouth.    . metroNIDAZOLE (METROGEL VAGINAL) 0.75 % vaginal gel Place 1 Applicatorful vaginally at bedtime for 5 days. 50 g 0   No current facility-administered medications for this visit.     Allergies: Latex  Past Medical History:  Diagnosis Date  . Cancer [facility]) 2017   breast  . Sickle cell anemia ([place])     Past Surgical History:  Procedure Laterality Date  . ABDOMINAL HYSTERECTOMY    . BREAST SURGERY      Family History  Problem Relation Age of Onset  . Kidney failure Mother   . Cancer Paternal Grandfather   . Colon cancer Neg Hx     Social History   Tobacco Use  . Smoking status: Former Research scientist (life sciences)  . Smokeless tobacco: Never Used  Substance Use Topics  . Alcohol use: Yes    Subjective:  Patient presents today as a new patient; in general, doing well; history of breast cancer- under care of hematology/ oncology at [facility]; diagnosed at age 73; last mammogram March 2019; admits not taking her Femara regularly due to concerns for side effects; has not discussed with her oncologist;  Is worried about weight gain; feels that her diet is good/ job is physical in nature; Up to date on dental and eye exams;  Feels that she has a vaginal discharge/ vaginal odor;   Review of Systems  Constitutional: Positive for malaise/fatigue. Negative for weight loss.  HENT: Negative for hearing loss.   Eyes: Negative for blurred vision.  Respiratory: Negative for cough.   Cardiovascular: Negative for chest pain and palpitations.  Gastrointestinal: Negative for abdominal pain.  Musculoskeletal: Negative for myalgias.  Skin: Negative.   Neurological: Negative for dizziness, weakness and headaches.   Psychiatric/Behavioral: Negative for depression and memory loss. The patient is not nervous/anxious and does not have insomnia.      Objective:  Vitals:   02/04/18 1129  BP: 116/80  Pulse: 84  Temp: 98.1 F (36.7 C)  TempSrc: Oral  SpO2: 98%  Weight: 243 lb 1.3 oz (110.3 kg)  Height: '5\' 4"'$  (1.626 m)    General: Well developed, well nourished, in no acute distress  Skin : Warm and dry.  Head: Normocephalic and atraumatic  Eyes: Sclera and conjunctiva clear; pupils round and reactive to light; extraocular movements intact  Ears: External normal; canals clear; tympanic membranes normal  Oropharynx: Pink, supple. No suspicious lesions  Neck: Supple without thyromegaly, adenopathy  Lungs: Respirations unlabored; clear to auscultation bilaterally without wheeze, rales, rhonchi  CVS exam: normal rate and regular rhythm.  Abdomen: Soft; nontender; nondistended; normoactive bowel sounds; no masses or hepatosplenomegaly  Musculoskeletal: No deformities; no active joint inflammation  Extremities: No edema, cyanosis, clubbing  Vessels: Symmetric bilaterally  Neurologic: Alert and oriented; speech intact; face symmetrical; moves all extremities well; CNII-XII intact without focal deficit   Assessment:  1. PE (physical exam), annual   2. Encounter for screening colonoscopy   3. Weight gain   4. Lipid screening   5. Other fatigue   6. Vaginal discharge     Plan:  Age appropriate preventive healthcare needs addressed; encouraged regular eye doctor and dental exams; encouraged regular exercise; will update labs and  refills as needed today; follow-up to be determined;  Stressed need to see her oncologist to discuss medication options; Thin Prep pap collected;  Labs updated today- may need to consider trial of Metformin to help with weight; follow-up to be determined.   No follow-ups on file.  Orders Placed This Encounter  Procedures  . CBC w/Diff    Standing Status:   Future     Number of Occurrences:   1    Standing Expiration Date:   02/04/2019  . Comp Met (CMET)    Standing Status:   Future    Number of Occurrences:   1    Standing Expiration Date:   02/04/2019  . Lipid panel    Standing Status:   Future    Number of Occurrences:   1    Standing Expiration Date:   02/05/2019  . HgB A1c    Standing Status:   Future    Number of Occurrences:   1    Standing Expiration Date:   02/04/2019  . TSH    Standing Status:   Future    Number of Occurrences:   1    Standing Expiration Date:   02/04/2019  . Vitamin D (25 hydroxy)    Standing Status:   Future    Number of Occurrences:   1    Standing Expiration Date:   02/04/2019  . Ambulatory referral to Gastroenterology    Referral Priority:   Routine    Referral Type:   Consultation    Referral Reason:   Specialty Services Required    Referred to Provider:   Doran Stabler, MD    Number of Visits Requested:   1    Requested Prescriptions   Signed Prescriptions Disp Refills  . metroNIDAZOLE (METROGEL VAGINAL) 0.75 % vaginal gel 50 g 0    Sig: Place 1 Applicatorful vaginally at bedtime for 5 days.

## 2018-02-07 ENCOUNTER — Other Ambulatory Visit: Payer: Self-pay | Admitting: Family

## 2018-02-07 MED ORDER — VITAMIN D (ERGOCALCIFEROL) 1.25 MG (50000 UNIT) PO CAPS: 50000.0000 [IU] | ORAL_CAPSULE | ORAL | 0 refills | Status: AC

## 2018-02-09 LAB — CYTOLOGY - PAP
BACTERIAL VAGINITIS: NEGATIVE
Candida vaginitis: NEGATIVE
Chlamydia: NEGATIVE
DIAGNOSIS: NEGATIVE
HPV: NOT DETECTED
NEISSERIA GONORRHEA: NEGATIVE
Trichomonas: NEGATIVE

## 2018-04-11 ENCOUNTER — Encounter: Payer: Self-pay | Admitting: Family

## 2018-05-02 ENCOUNTER — Other Ambulatory Visit: Payer: Self-pay | Admitting: Family

## 2018-05-02 ENCOUNTER — Telehealth: Payer: Self-pay | Admitting: Family

## 2018-05-02 DIAGNOSIS — E559 Vitamin D deficiency, unspecified: Secondary | ICD-10-CM

## 2018-05-02 NOTE — Telephone Encounter (Signed)
Please let her know that she needs to come get her labs done as we discussed in November before we can refill the Vitamin D for her.

## 2018-05-03 NOTE — Telephone Encounter (Signed)
Called and left message for patient today 

## 2018-09-09 ENCOUNTER — Telehealth: Payer: Self-pay | Admitting: Orthopedic Surgery

## 2018-09-09 ENCOUNTER — Other Ambulatory Visit: Payer: Self-pay

## 2018-09-09 NOTE — Telephone Encounter (Signed)
Pt called in requesting a note from dr.duda stating that the pt could not work from the point she had surgery to the point she was sent to do her leg exercises for therapy.  [phone]

## 2018-09-09 NOTE — Telephone Encounter (Signed)
This patient had a right knee scop on 04/27/17 and last and only visit after surgery was 05/06/17.

## 2018-09-09 NOTE — Telephone Encounter (Signed)
I called and sw pt. She needs this letter to file for insurance. Had had MRI 10/2016 advised at that time she needed surgery but she received a new diagnosis of breast cancer and need to undergo treatment thus needing to postpone the surgery until after this was complete. The pt had surgery on 04/27/17, first ost op on 05/06/17 and first physical therapy appointment on 05/11/17 which is went she returned to work. The was emailed to the pt and she will call with any questions or concerns.

## 2018-10-15 IMAGING — CR DG ABDOMEN ACUTE W/ 1V CHEST
4 series · 4 of 4 positions shown · non-contrast
Comparison: 03/14/2009 chest radiograph

CLINICAL DATA: Abdominal pain for 1 month.

EXAM:
DG ABDOMEN ACUTE W/ 1V CHEST

[w chest pa]
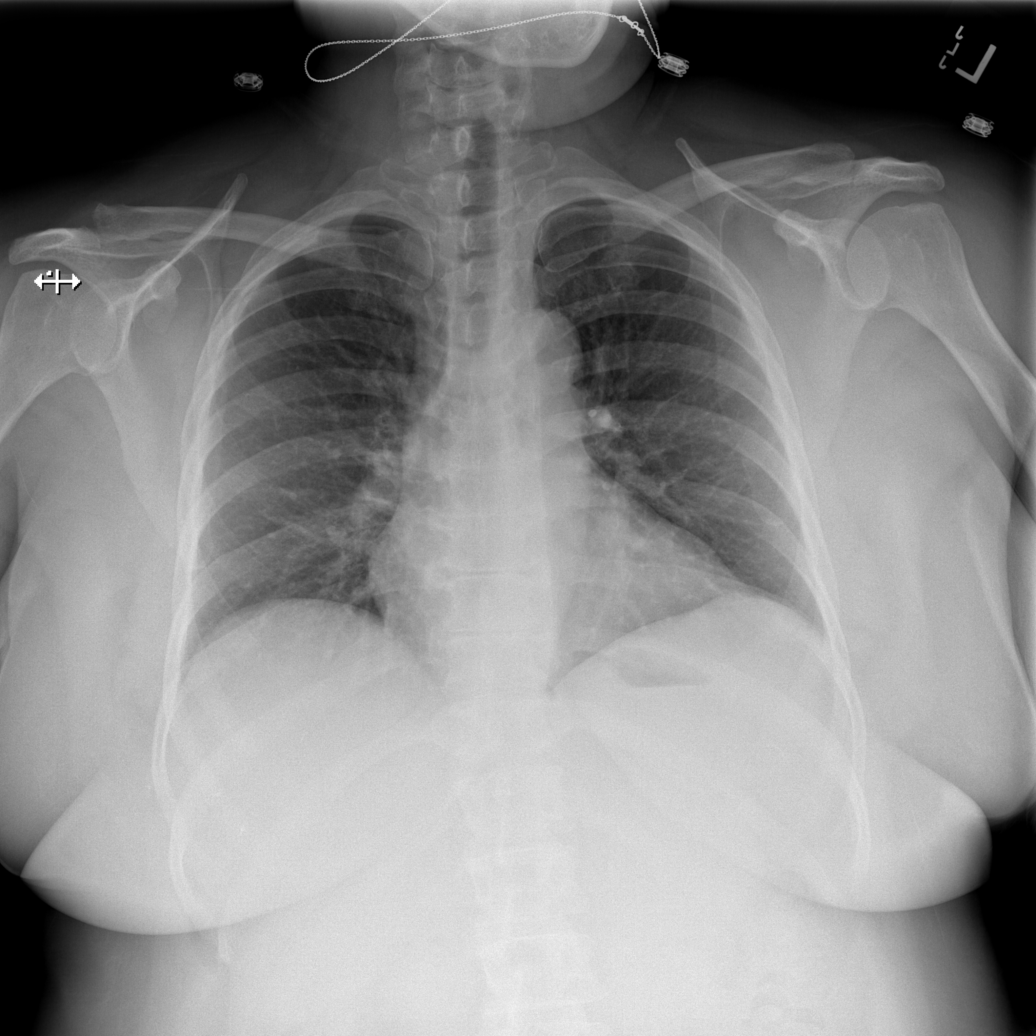

[w abdomen upright]
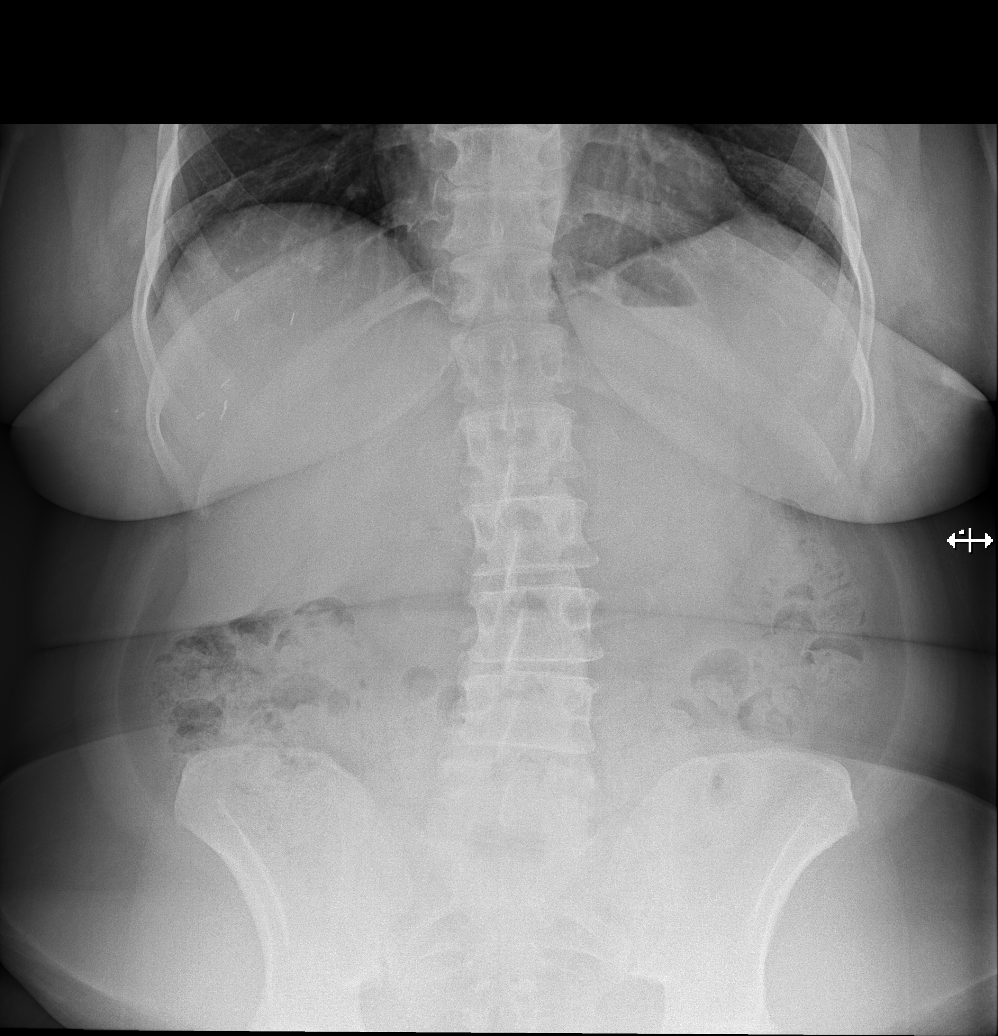

[t abdomen supine (1 of 2)]
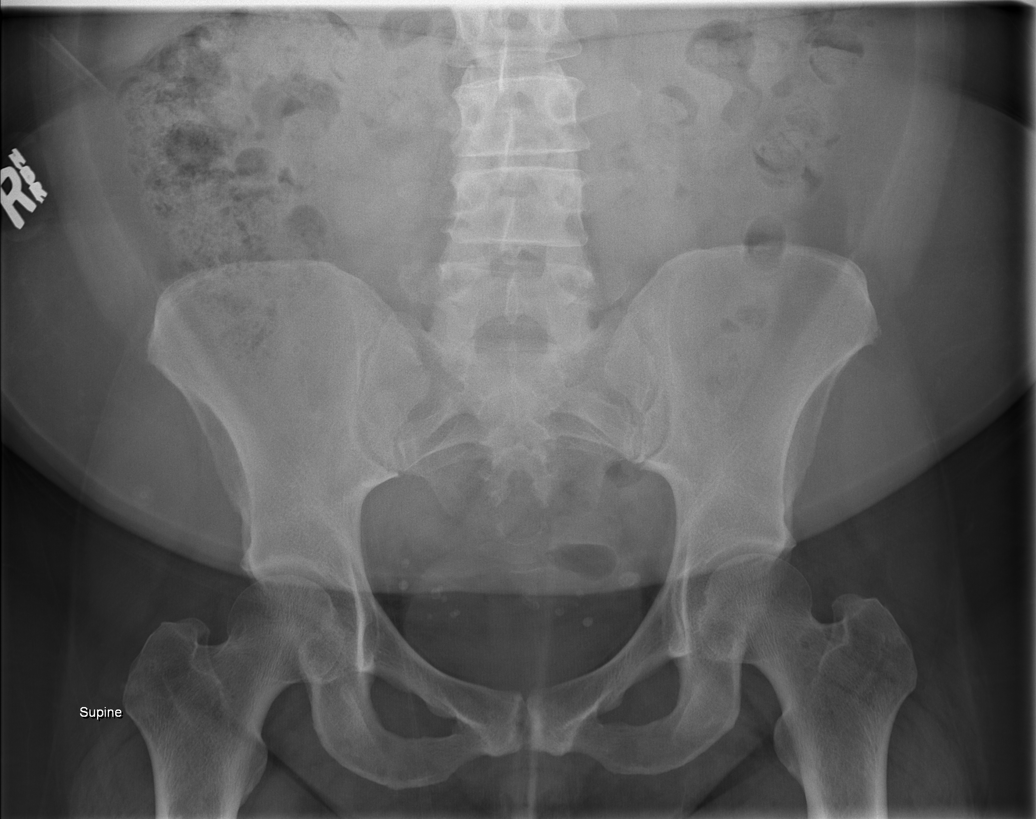

[t abdomen supine (2 of 2)]
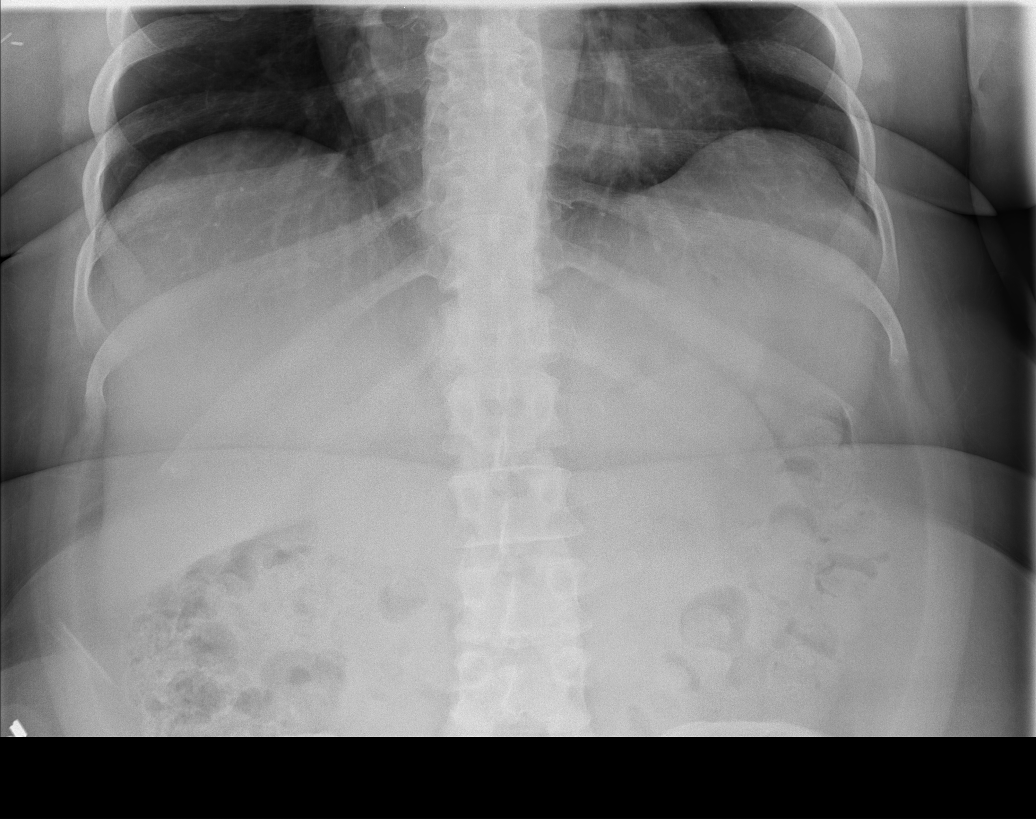

[4 of 4 positions shown; findings below may reference images not displayed]

FINDINGS: The cardiomediastinal silhouette is unremarkable.

The lungs are clear.

No airspace disease, pleural effusion or pneumothorax.

The bowel gas pattern is unremarkable.

There is no evidence of bowel obstruction or pneumoperitoneum.

No suspicious calcifications are present.

No acute bony abnormalities are noted.
IMPRESSION: Negative abdominal radiographs.  No acute cardiopulmonary disease.

## 2020-01-31 ENCOUNTER — Encounter (HOSPITAL_COMMUNITY): Payer: Self-pay

## 2020-01-31 ENCOUNTER — Ambulatory Visit (HOSPITAL_COMMUNITY)
Admission: EM | Admit: 2020-01-31 | Discharge: 2020-01-31 | Disposition: A | Discharge status: Home / Self Care | Payer: Self-pay | Attending: Family Medicine | Admitting: Family Medicine

## 2020-01-31 ENCOUNTER — Other Ambulatory Visit: Payer: Self-pay

## 2020-01-31 DIAGNOSIS — H6121 Impacted cerumen, right ear: Secondary | ICD-10-CM

## 2020-01-31 NOTE — ED Provider Notes (Signed)
[place]   [id number] 01/31/20 Arrival Time: 0930  ASSESSMENT & PLAN:  1. Impacted cerumen of right ear     Wax too deep in ear canal to remove with curette. Ears flushed by RN; successful. Pt reports complete relief. D/C home.  Reviewed expectations re: course of current medical issues. Questions answered. Outlined signs and symptoms indicating need for more acute intervention. Patient verbalized understanding. After Visit Summary given.   SUBJECTIVE: History from: patient.  Laurie French is a 54 y.o. female who presents with complaint of right ear fullness; several days; h/o cerumen impaction. No specific pain. No drainage or bleeding. Hearing normal. OTC "ear cleaning kit" without relief. No ear trauma.   Social History   Tobacco Use  Smoking Status Former Smoker  Smokeless Tobacco Never Used      OBJECTIVE:  Vitals:   01/31/20 0955 01/31/20 0956  BP:  140/86  Pulse:  75  Resp:  17  Temp:  98.4 F (36.9 C)  TempSrc:  Oral  SpO2:  98%  Weight: 108.9 kg   Height: $Remove'5\' 4"'[id number]$  (1.626 m)      General appearance: alert; NAD Ear Canal: cerumen on the right TM: normal after cerumen removal Neck: supple without LAD Lungs: unlabored respirations, symmetrical air entry; cough: absent; no respiratory distress Skin: warm and dry Psychological: alert and cooperative; normal mood and affect  Allergies  Allergen Reactions  . Latex Itching and Rash    Past Medical History:  Diagnosis Date  . Cancer [facility]) 2017   breast  . Sickle cell anemia ([facility])    Family History  Problem Relation Age of Onset  . Kidney failure Mother   . Cancer Paternal Grandfather   . Colon cancer Neg Hx    Social History   Socioeconomic History  . Marital status: Divorced    Spouse name: Not on file  . Number of children: Not on file  . Years of education: Not on file  . Highest education level: Not on file  Occupational History  . Not on file  Tobacco Use  .  Smoking status: Former Research scientist (life sciences)  . Smokeless tobacco: Never Used  Substance and Sexual Activity  . Alcohol use: Yes  . Drug use: No  . Sexual activity: Yes  Other Topics Concern  . Not on file  Social History Narrative  . Not on file   Social Determinants of Health   Financial Resource Strain:   . Difficulty of Paying Living Expenses: Not on file  Food Insecurity:   . Worried About Charity fundraiser in the Last Year: Not on file  . Ran Out of Food in the Last Year: Not on file  Transportation Needs:   . Lack of Transportation (Medical): Not on file  . Lack of Transportation (Non-Medical): Not on file  Physical Activity:   . Days of Exercise per Week: Not on file  . Minutes of Exercise per Session: Not on file  Stress:   . Feeling of Stress : Not on file  Social Connections:   . Frequency of Communication with Friends and Family: Not on file  . Frequency of Social Gatherings with Friends and Family: Not on file  . Attends Religious Services: Not on file  . Active Member of Clubs or Organizations: Not on file  . Attends Archivist Meetings: Not on file  . Marital Status: Not on file  Intimate Partner Violence:   . Fear of Current or Ex-Partner: Not on file  .  Emotionally Abused: Not on file  . Physically Abused: Not on file  . Sexually Abused: Not on file            Vanessa Kick, MD 01/31/20 1126

## 2020-01-31 NOTE — ED Triage Notes (Signed)
Pt presents with otalgia (R), aching and she attempted to clean with home kit for ear wash with no relief. Symptoms onset 3 days. No other concerns.

## 2020-09-03 ENCOUNTER — Other Ambulatory Visit: Payer: Self-pay

## 2020-09-03 ENCOUNTER — Encounter (HOSPITAL_COMMUNITY): Payer: Self-pay | Admitting: *Deleted

## 2020-09-03 ENCOUNTER — Emergency Department (HOSPITAL_COMMUNITY)
Admission: EM | Admit: 2020-09-03 | Discharge: 2020-09-03 | Disposition: A | Discharge status: Home / Self Care | Payer: Self-pay | Attending: Emergency Medicine | Admitting: Emergency Medicine

## 2020-09-03 DIAGNOSIS — Z87891 Personal history of nicotine dependence: Secondary | ICD-10-CM | POA: Insufficient documentation

## 2020-09-03 DIAGNOSIS — Z853 Personal history of malignant neoplasm of breast: Secondary | ICD-10-CM | POA: Insufficient documentation

## 2020-09-03 DIAGNOSIS — Z91040 Latex allergy status: Secondary | ICD-10-CM | POA: Insufficient documentation

## 2020-09-03 DIAGNOSIS — M62838 Other muscle spasm: Secondary | ICD-10-CM | POA: Insufficient documentation

## 2020-09-03 MED ORDER — METHOCARBAMOL 500 MG PO TABS: 500.0000 mg | ORAL_TABLET | Freq: Three times a day (TID) | ORAL | 0 refills | Status: DC | PRN

## 2020-09-03 MED ORDER — NAPROXEN 500 MG PO TABS: 500.0000 mg | ORAL_TABLET | Freq: Two times a day (BID) | ORAL | 0 refills | Status: DC | PRN

## 2020-09-03 MED ORDER — LIDOCAINE 5 % EX PTCH: 1.0000 | MEDICATED_PATCH | Freq: Every day | CUTANEOUS | 0 refills | Status: DC | PRN

## 2020-09-03 NOTE — ED Provider Notes (Signed)
[facility]ERGENCY DEPARTMENT Provider Note   CSN: [id number] Arrival date & time: 09/03/20  0031     History Chief Complaint  Patient presents with   Neck Pain    Laurie French is a 55 y.o. female who presents to the ED with complaints of neck pain since this AM. Patient states she went to bed with tenseness/stress to the neck/shoulders and woke up with worsening discomfort and difficulty turning her head. No alleviating factors. No injury/change in activity. Pain feels like spasms/tightness. Denies fever, chills, numbness, weakness, incontinence, or trauma. No hx of IVDU.   HPI     Past Medical History:  Diagnosis Date   Cancer ([place]) 2017   breast   Sickle cell anemia ([place])     Patient Active Problem List   Diagnosis Date Noted   Breast cancer ([place]) 11/05/2015    Past Surgical History:  Procedure Laterality Date   ABDOMINAL HYSTERECTOMY     BREAST SURGERY       OB History   No obstetric history on file.     Family History  Problem Relation Age of Onset   Kidney failure Mother    Cancer Paternal Grandfather    Colon cancer Neg Hx     Social History   Tobacco Use   Smoking status: Former    Pack years: 0.00   Smokeless tobacco: Never  Substance Use Topics   Alcohol use: Yes   Drug use: No    Home Medications Prior to Admission medications   Not on File    Allergies    Latex  Review of Systems   Review of Systems  Constitutional:  Negative for chills and fever.  Respiratory:  Negative for shortness of breath.   Cardiovascular:  Negative for chest pain.  Gastrointestinal:  Negative for abdominal pain.  Musculoskeletal:  Positive for neck pain.  Neurological:  Negative for weakness and numbness.       Negative for incontinence/saddle anesthesia.   All other systems reviewed and are negative.  Physical Exam Updated Vital Signs BP (!) 148/85   Pulse 78   Temp 98.4 F (36.9 C) (Oral)   Resp 20   SpO2 97%    Physical Exam Vitals and nursing note reviewed.  Constitutional:      General: She is not in acute distress.    Appearance: She is well-developed.  HENT:     Head: Normocephalic and atraumatic.  Eyes:     General:        Right eye: No discharge.        Left eye: No discharge.     Conjunctiva/sclera: Conjunctivae normal.  Neck:     Comments: Mild limitation in lateral rotation of the neck, but able to do so somewhat in both directions. No nuchal rigidity.  Cardiovascular:     Rate and Rhythm: Normal rate and regular rhythm.     Comments: + symmetric radial pulses.  Pulmonary:     Effort: Pulmonary effort is normal.     Breath sounds: Normal breath sounds.  Abdominal:     General: There is no distension.  Musculoskeletal:     Cervical back: Neck supple. Tenderness (bilateral paraspinal muscles) present. No spinous process tenderness.     Comments: Ues: Actively ranging at all major joints. No focal bony tenderness  Skin:    General: Skin is warm and dry.  Neurological:     Mental Status: She is alert.     Comments: Clear  speech. Sensation grossly intact to bilateral upper/lower extremities. 5/5 symmetric grip strength & strength with plantar dorsiflexion bilaterally. Ambulatory.    Psychiatric:        Behavior: Behavior normal.        Thought Content: Thought content normal.    ED Results / Procedures / Treatments   Labs (all labs ordered are listed, but only abnormal results are displayed) Labs Reviewed - No data to display  EKG None  Radiology No results found.  Procedures Procedures   Medications Ordered in ED Medications - No data to display  ED Course  I have reviewed the triage vital signs and the nursing notes.  Pertinent labs & imaging results that were available during my care of the patient were reviewed by me and considered in my medical decision making (see chart for details).    MDM Rules/Calculators/A&P                         Patient  presents to the ED with complaints of neck pain.  Nontoxic, vitals w/ mildly elevated BP- doubt HTN emergency.   Additional history obtained:  Additional history obtained from chart review & nursing note review.   ED Course:  No trauma or midline tenderness to raise concern for fx. No neuro deficits to raise concern for cord compression.  Afebrile without nuchal rigidity or hx of IVDU- do not suspect epidural abscess or meningitis.   Likely muscular strain/spasms. Will tx supportively w/ PCP follow up.   I discussed treatment plan, need for follow-up, and return precautions with the patient. Provided opportunity for questions, patient confirmed understanding and is in agreement with plan.   Portions of this note were generated with [employer]. Dictation errors may occur despite best attempts at proofreading.  Final Clinical Impression(s) / ED Diagnoses Final diagnoses:  Muscle spasms of neck    Rx / DC Orders ED Discharge Orders          Ordered    naproxen (NAPROSYN) 500 MG tablet  2 times daily PRN        09/03/20 0049    methocarbamol (ROBAXIN) 500 MG tablet  Every 8 hours PRN        09/03/20 0049    lidocaine (LIDODERM) 5 %  Daily PRN        09/03/20 0049             Amaryllis Dyke, PA-C 09/03/20 0116    Mesner, Corene Cornea, MD 09/03/20 [phone]

## 2020-09-03 NOTE — Discharge Instructions (Addendum)
You were seen in the emergency department for neck pain today.  At this time we suspect that your pain is related to a muscle strain/spasm.   I have prescribed you an anti-inflammatory medication and a muscle relaxer.  - Naproxen is a nonsteroidal anti-inflammatory medication that will help with pain and swelling. Be sure to take this medication as prescribed with food, 1 pill every 12 hours,  It should be taken with food, as it can cause stomach upset, and more seriously, stomach bleeding. Do not take other nonsteroidal anti-inflammatory medications with this such as Advil, Motrin, Aleve, Mobic, Goodie Powder, or Motrin.    - Robaxin is the muscle relaxer I have prescribed, this is meant to help with muscle tightness. Be aware that this medication may make you drowsy therefore the first time you take this it should be at a time you are in an environment where you can rest. Do not drive or operate heavy machinery when taking this medication. Do not drink alcohol or take other sedating medications with this medicine such as narcotics or benzodiazepines.   - Lidoderm- apply to your are of most significant pain. Remove and discard patch within 12 hours of application.   You make take Tylenol per over the counter dosing with these medications.   We have prescribed you new medication(s) today. Discuss the medications prescribed today with your pharmacist as they can have adverse effects and interactions with your other medicines including over the counter and prescribed medications. Seek medical evaluation if you start to experience new or abnormal symptoms after taking one of these medicines, seek care immediately if you start to experience difficulty breathing, feeling of your throat closing, facial swelling, or rash as these could be indications of a more serious allergic reaction   The application of heat can help soothe the pain.    Follow up with primary care within 3 days.  However return to the  ER should you develop ne or worsening symptoms or any other concerns including but not limited to severe or worsening pain, low back pain with fever, numbness, weakness, loss of bowel or bladder control, or inability to walk or urinate, you should return to the ER immediately.

## 2020-09-03 NOTE — ED Triage Notes (Signed)
Pt reports waking up with right side neck pain. Denies injury. Ambulatory at triage.

## 2020-11-11 ENCOUNTER — Ambulatory Visit: Payer: Self-pay | Admitting: Gastroenterology

## 2021-01-27 ENCOUNTER — Encounter (HOSPITAL_COMMUNITY): Payer: Self-pay | Admitting: Emergency Medicine

## 2021-01-27 ENCOUNTER — Other Ambulatory Visit: Payer: Self-pay

## 2021-01-27 ENCOUNTER — Emergency Department (HOSPITAL_COMMUNITY)
Admission: EM | Admit: 2021-01-27 | Discharge: 2021-01-27 | Disposition: A | Discharge status: Home / Self Care | Payer: 59 | Attending: Emergency Medicine | Admitting: Emergency Medicine

## 2021-01-27 DIAGNOSIS — Z87891 Personal history of nicotine dependence: Secondary | ICD-10-CM | POA: Diagnosis not present

## 2021-01-27 DIAGNOSIS — Z853 Personal history of malignant neoplasm of breast: Secondary | ICD-10-CM | POA: Diagnosis not present

## 2021-01-27 DIAGNOSIS — M542 Cervicalgia: Secondary | ICD-10-CM | POA: Diagnosis present

## 2021-01-27 DIAGNOSIS — Z91040 Latex allergy status: Secondary | ICD-10-CM | POA: Insufficient documentation

## 2021-01-27 DIAGNOSIS — M62838 Other muscle spasm: Secondary | ICD-10-CM | POA: Insufficient documentation

## 2021-01-27 MED ORDER — LIDOCAINE 5 % EX PTCH: 1.0000 | MEDICATED_PATCH | Freq: Every day | CUTANEOUS | 0 refills | Status: AC | PRN

## 2021-01-27 MED ORDER — METHOCARBAMOL 500 MG PO TABS: 500.0000 mg | ORAL_TABLET | Freq: Three times a day (TID) | ORAL | 0 refills | Status: AC | PRN

## 2021-01-27 MED ORDER — NAPROXEN 500 MG PO TABS: 500.0000 mg | ORAL_TABLET | Freq: Two times a day (BID) | ORAL | 0 refills | Status: AC | PRN

## 2021-01-27 MED ORDER — METHOCARBAMOL 500 MG PO TABS: 500.0000 mg | ORAL_TABLET | Freq: Three times a day (TID) | ORAL | 0 refills | Status: DC | PRN

## 2021-01-27 MED ORDER — NAPROXEN 500 MG PO TABS: 500.0000 mg | ORAL_TABLET | Freq: Two times a day (BID) | ORAL | 0 refills | Status: DC | PRN

## 2021-01-27 MED ORDER — LIDOCAINE 5 % EX PTCH: 1.0000 | MEDICATED_PATCH | Freq: Every day | CUTANEOUS | 0 refills | Status: DC | PRN

## 2021-01-27 NOTE — ED Provider Notes (Signed)
[facility] EMERGENCY DEPARTMENT Provider Note   CSN: [id number] Arrival date & time: 01/27/21  0306     History Chief Complaint  Patient presents with   Neck Pain    Laurie French is a 55 y.o. female.  Patient to ED with pain that started in the left lateral neck 2 days ago and now affects both sides of her neck. Worse with movement. She came to the ED in June with the same symptoms and was told it was muscle spasms. No radiating pain, no numbness or weakness of extremities. No chest pain, headache or fever.   The history is provided by the patient. No language interpreter was used.  Neck Pain Associated symptoms: no chest pain, no fever, no headaches, no numbness and no weakness       Past Medical History:  Diagnosis Date   Cancer ([place]) 2017   breast   Sickle cell anemia ([place])     Patient Active Problem List   Diagnosis Date Noted   Breast cancer ([place]) 11/05/2015    Past Surgical History:  Procedure Laterality Date   ABDOMINAL HYSTERECTOMY     BREAST SURGERY       OB History   No obstetric history on file.     Family History  Problem Relation Age of Onset   Kidney failure Mother    Cancer Paternal Grandfather    Colon cancer Neg Hx     Social History   Tobacco Use   Smoking status: Former   Smokeless tobacco: Never  Substance Use Topics   Alcohol use: Yes   Drug use: No    Home Medications Prior to Admission medications   Medication Sig Start Date End Date Taking? Authorizing Provider  lidocaine (LIDODERM) 5 % Place 1 patch onto the skin daily as needed. Apply patch to area most significant pain once per day.  Remove and discard patch within 12 hours of application. 01/27/21   Charlann Lange, PA-C  methocarbamol (ROBAXIN) 500 MG tablet Take 1 tablet (500 mg total) by mouth every 8 (eight) hours as needed for muscle spasms. 01/27/21   Charlann Lange, PA-C  naproxen (NAPROSYN) 500 MG tablet Take 1 tablet (500 mg total) by mouth 2  (two) times daily as needed for moderate pain. 01/27/21   Charlann Lange, PA-C    Allergies    Latex  Review of Systems   Review of Systems  Constitutional:  Negative for fever.  Respiratory:  Negative for shortness of breath.   Cardiovascular:  Negative for chest pain.  Musculoskeletal:  Positive for neck pain.  Skin:  Negative for color change.  Neurological:  Negative for weakness, numbness and headaches.   Physical Exam Updated Vital Signs Ht 5\' 4"  (1.626 m)   Wt 110 kg   BMI 41.63 kg/m   Physical Exam Constitutional:      General: She is not in acute distress.    Appearance: She is well-developed. She is not ill-appearing.  Pulmonary:     Effort: Pulmonary effort is normal.  Musculoskeletal:        General: Normal range of motion.     Cervical back: Normal range of motion.     Comments: Paraspinal tenderness with muscle tension to bilateral areas. FROM UE's with symmetric and full strength. Distal pulses present.   Skin:    General: Skin is warm and dry.  Neurological:     Mental Status: She is alert and oriented to person, place, and time.  Sensory: No sensory deficit.    ED Results / Procedures / Treatments   Labs (all labs ordered are listed, but only abnormal results are displayed) Labs Reviewed - No data to display  EKG None  Radiology No results found.  Procedures Procedures   Medications Ordered in ED Medications - No data to display  ED Course  I have reviewed the triage vital signs and the nursing notes.  Pertinent labs & imaging results that were available during my care of the patient were reviewed by me and considered in my medical decision making (see chart for details).    MDM Rules/Calculators/A&P                           Patient to ED with recurrent muscular spasms in her neck. No evidence of radiculopathy, infection, cervical issue. Medications provided. Encouraged PCP follow up.   Final Clinical Impression(s) / ED  Diagnoses Final diagnoses:  Muscle spasms of neck    Rx / DC Orders ED Discharge Orders          Ordered    lidocaine (LIDODERM) 5 %  Daily PRN        01/27/21 0321    methocarbamol (ROBAXIN) 500 MG tablet  Every 8 hours PRN        01/27/21 0321    naproxen (NAPROSYN) 500 MG tablet  2 times daily PRN        01/27/21 0321             Charlann Lange, PA-C 01/27/21 0328    Ezequiel Essex, MD 01/27/21 0330

## 2021-01-27 NOTE — ED Triage Notes (Signed)
Patient reports persistent posterior neck pain for 3 days , denies injury .

## 2021-01-27 NOTE — Discharge Instructions (Signed)
Follow up with your doctor if symptoms recur.   Take medications as prescribed.
